# Patient Record
Sex: Female | Born: 1995 | Hispanic: No | Marital: Single | Smoking: Never smoker
Health system: Southern US, Community
[De-identification: ages and names within clinical notes are randomized; demographics above are authoritative.]

## PROBLEM LIST (undated history)

## (undated) ENCOUNTER — Ambulatory Visit (HOSPITAL_COMMUNITY): Admission: EM | Disposition: A | Discharge status: Home / Self Care | Payer: Self-pay

## (undated) DIAGNOSIS — F32A Depression, unspecified: Secondary | ICD-10-CM

## (undated) DIAGNOSIS — F419 Anxiety disorder, unspecified: Secondary | ICD-10-CM

## (undated) DIAGNOSIS — O24419 Gestational diabetes mellitus in pregnancy, unspecified control: Secondary | ICD-10-CM

## (undated) HISTORY — PX: CHOLECYSTECTOMY: SHX55

---

## 2016-10-24 ENCOUNTER — Observation Stay
Admission: AD | Admit: 2016-10-24 | Payer: Self-pay | Source: Other Acute Inpatient Hospital | Admitting: Internal Medicine

## 2020-03-04 ENCOUNTER — Emergency Department (HOSPITAL_COMMUNITY): Admission: EM | Admit: 2020-03-04 | Payer: Self-pay | Source: Home / Self Care

## 2020-03-04 ENCOUNTER — Other Ambulatory Visit: Payer: Self-pay

## 2020-04-14 ENCOUNTER — Encounter: Payer: Self-pay | Admitting: Emergency Medicine

## 2020-04-14 ENCOUNTER — Emergency Department (HOSPITAL_COMMUNITY)
Admission: EM | Admit: 2020-04-14 | Discharge: 2020-04-14 | Disposition: A | Discharge status: Home / Self Care | Payer: Medicaid Other | Attending: Emergency Medicine | Admitting: Emergency Medicine

## 2020-04-14 DIAGNOSIS — O98319 Other infections with a predominantly sexual mode of transmission complicating pregnancy, unspecified trimester: Secondary | ICD-10-CM | POA: Diagnosis present

## 2020-04-14 DIAGNOSIS — Z202 Contact with and (suspected) exposure to infections with a predominantly sexual mode of transmission: Secondary | ICD-10-CM | POA: Insufficient documentation

## 2020-04-14 DIAGNOSIS — Z3A00 Weeks of gestation of pregnancy not specified: Secondary | ICD-10-CM | POA: Insufficient documentation

## 2020-04-14 LAB — URINALYSIS, ROUTINE W REFLEX MICROSCOPIC
Bilirubin Urine: NEGATIVE
Glucose, UA: NEGATIVE mg/dL
Hgb urine dipstick: NEGATIVE
Ketones, ur: NEGATIVE mg/dL
Leukocytes,Ua: NEGATIVE
Nitrite: NEGATIVE
Protein, ur: NEGATIVE mg/dL
Specific Gravity, Urine: 1.014 (ref 1.005–1.030)
pH: 6 (ref 5.0–8.0)

## 2020-04-14 LAB — POC URINE PREG, ED: Preg Test, Ur: POSITIVE — AB

## 2020-04-14 MED ORDER — LIDOCAINE HCL (PF) 1 % IJ SOLN
INTRAMUSCULAR | Status: AC
  Filled 2020-04-14: qty 5

## 2020-04-14 MED ORDER — AZITHROMYCIN 1 G PO PACK
1.0000 g | PACK | Freq: Once | ORAL | Status: AC
  Administered 2020-04-14: 1 g via ORAL
  Filled 2020-04-14: qty 1

## 2020-04-14 MED ORDER — LIDOCAINE HCL (PF) 1 % IJ SOLN
1.0000 mL | Freq: Once | INTRAMUSCULAR | Status: AC
  Administered 2020-04-14: 1 mL

## 2020-04-14 MED ORDER — CEFTRIAXONE SODIUM 500 MG IJ SOLR
500.0000 mg | Freq: Once | INTRAMUSCULAR | Status: AC
  Administered 2020-04-14: 500 mg via INTRAMUSCULAR
  Filled 2020-04-14: qty 500

## 2020-04-14 NOTE — ED Provider Notes (Signed)
[facility] EMERGENCY DEPARTMENT Provider Note   CSN: [id number] Arrival date & time: 04/14/20  2536     History  CC: std exposure  Tammy Mendez is a 24 y.o. female.  Patient here for treatment again for chlamydia.  Tested positive for recently but her sex partner never got treated.  They had sex after she completed treatment and concern for need for treatment.  She states that she tested positive for pregnancy today.  But not having any abdominal pain or vaginal bleeding.  The history is provided by the patient.  Exposure to STD This is a new problem. The problem has not changed since onset.Nothing aggravates the symptoms. Nothing relieves the symptoms. She has tried nothing for the symptoms. The treatment provided no relief.       No past medical history on file.  There are no problems to display for this patient.    OB History    Gravida  1   Para      Term      Preterm      AB      Living        SAB      TAB      Ectopic      Multiple      Live Births              No family history on file.  Social History   Tobacco Use  . Smoking status: Not on file  Substance Use Topics  . Alcohol use: Not on file  . Drug use: Not on file    Home Medications Prior to Admission medications   Not on File    Allergies    Patient has no allergy information on record.  Review of Systems   Review of Systems  Constitutional: Negative for fever.  Genitourinary: Negative for decreased urine volume, difficulty urinating, dyspareunia, dysuria, enuresis, flank pain, frequency, genital sores, hematuria, menstrual problem, pelvic pain, urgency, vaginal bleeding, vaginal discharge and vaginal pain.    Physical Exam Updated Vital Signs  ED Triage Vitals  Enc Vitals Group     BP 04/14/20 0912 (!) 125/100     Pulse Rate 04/14/20 0912 96     Resp 04/14/20 0912 18     Temp 04/14/20 0912 98.2 F (36.8 C)     Temp Source 04/14/20 0912 Oral      SpO2 04/14/20 0912 98 %     Weight --      Height --      Head Circumference --      Peak Flow --      Pain Score 04/14/20 0919 0     Pain Loc --      Pain Edu? --      Excl. in GC? --     Physical Exam Constitutional:      General: She is not in acute distress.    Appearance: She is not ill-appearing.  Abdominal:     General: Abdomen is flat.     Tenderness: There is no abdominal tenderness.  Neurological:     Mental Status: She is alert.     ED Results / Procedures / Treatments   Labs (all labs ordered are listed, but only abnormal results are displayed) Labs Reviewed  POC URINE PREG, ED - Abnormal; Notable for the following components:      Result Value   Preg Test, Ur POSITIVE (*)    All other components within  normal limits  URINALYSIS, ROUTINE W REFLEX MICROSCOPIC  GC/CHLAMYDIA PROBE AMP ([place]) NOT AT [facility]    EKG None  Radiology No results found.  Procedures Procedures (including critical care time)  Medications Ordered in ED Medications  azithromycin (ZITHROMAX) powder 1 g (has no administration in time range)  cefTRIAXone (ROCEPHIN) injection 500 mg (has no administration in time range)    ED Course  I have reviewed the triage vital signs and the nursing notes.  Pertinent labs & imaging results that were available during my care of the patient were reviewed by me and considered in my medical decision making (see chart for details).    MDM Rules/Calculators/A&P                      Tammy Mendez is a 24 year old female here for STD check.  Normal vitals.  No fever.  No symptoms.  Recently found out she is pregnant this morning.  Not having any abdominal pain, vaginal bleeding, discharge.  She just finished course of treatment for chlamydia but states that her sex partner did not get treated and he is now having symptoms.  They have had sex since her treatment.  We will treat with azithro and Rocephin given her pregnancy.  She did just  finished a course of doxycycline.  Given information to follow-up at [facility] clinic.  Recommend that she abstain from sexual activity until she has confirm negative results with repeat pelvic with women's in the future.  Understands return precautions.  This chart was dictated using voice recognition software.  Despite best efforts to proofread,  errors can occur which can change the documentation meaning.    Final Clinical Impression(s) / ED Diagnoses Final diagnoses:  STD exposure    Rx / DC Orders ED Discharge Orders    None       Lennice Sites, DO 04/14/20 1038

## 2020-04-14 NOTE — ED Triage Notes (Signed)
Pt here with c/o std check , pt was just treated for a std 2 weeks ago but her boyfriend never got treated  And they had intercourse again , pt also had a postive pregnancy test this morning

## 2020-04-15 LAB — GC/CHLAMYDIA PROBE AMP (~~LOC~~) NOT AT ARMC
Chlamydia: NEGATIVE
Comment: NEGATIVE
Comment: NORMAL
Neisseria Gonorrhea: NEGATIVE

## 2021-02-01 ENCOUNTER — Other Ambulatory Visit: Payer: Self-pay

## 2021-02-01 ENCOUNTER — Encounter: Payer: Self-pay | Admitting: Emergency Medicine

## 2021-02-01 ENCOUNTER — Emergency Department
Admission: EM | Admit: 2021-02-01 | Discharge: 2021-02-01 | Disposition: A | Discharge status: Home / Self Care | Payer: Medicaid Other | Attending: Emergency Medicine | Admitting: Emergency Medicine

## 2021-02-01 ENCOUNTER — Emergency Department: Payer: Medicaid Other

## 2021-02-01 DIAGNOSIS — Z91040 Latex allergy status: Secondary | ICD-10-CM | POA: Insufficient documentation

## 2021-02-01 DIAGNOSIS — R102 Pelvic and perineal pain: Secondary | ICD-10-CM

## 2021-02-01 DIAGNOSIS — D649 Anemia, unspecified: Secondary | ICD-10-CM | POA: Diagnosis not present

## 2021-02-01 DIAGNOSIS — N939 Abnormal uterine and vaginal bleeding, unspecified: Secondary | ICD-10-CM | POA: Diagnosis not present

## 2021-02-01 HISTORY — DX: Gestational diabetes mellitus in pregnancy, unspecified control: O24.419

## 2021-02-01 LAB — CBC WITH DIFFERENTIAL/PLATELET
Abs Immature Granulocytes: 0.02 10*3/uL (ref 0.00–0.07)
Basophils Absolute: 0 10*3/uL (ref 0.0–0.1)
Basophils Relative: 0 %
Eosinophils Absolute: 0.2 10*3/uL (ref 0.0–0.5)
Eosinophils Relative: 2 %
HCT: 33.2 % — ABNORMAL LOW (ref 36.0–46.0)
Hemoglobin: 9.9 g/dL — ABNORMAL LOW (ref 12.0–15.0)
Immature Granulocytes: 0 %
Lymphocytes Relative: 27 %
Lymphs Abs: 2.7 10*3/uL (ref 0.7–4.0)
MCH: 22 pg — ABNORMAL LOW (ref 26.0–34.0)
MCHC: 29.8 g/dL — ABNORMAL LOW (ref 30.0–36.0)
MCV: 73.8 fL — ABNORMAL LOW (ref 80.0–100.0)
Monocytes Absolute: 0.7 10*3/uL (ref 0.1–1.0)
Monocytes Relative: 7 %
Neutro Abs: 6.4 10*3/uL (ref 1.7–7.7)
Neutrophils Relative %: 64 %
Platelets: 360 10*3/uL (ref 150–400)
RBC: 4.5 MIL/uL (ref 3.87–5.11)
RDW: 16.1 % — ABNORMAL HIGH (ref 11.5–15.5)
WBC: 10 10*3/uL (ref 4.0–10.5)
nRBC: 0 % (ref 0.0–0.2)

## 2021-02-01 LAB — WET PREP, GENITAL
Clue Cells Wet Prep HPF POC: NONE SEEN
Sperm: NONE SEEN
Trich, Wet Prep: NONE SEEN
Yeast Wet Prep HPF POC: NONE SEEN

## 2021-02-01 LAB — COMPREHENSIVE METABOLIC PANEL
ALT: 16 U/L (ref 0–44)
AST: 17 U/L (ref 15–41)
Albumin: 3.7 g/dL (ref 3.5–5.0)
Alkaline Phosphatase: 63 U/L (ref 38–126)
Anion gap: 8 (ref 5–15)
BUN: 15 mg/dL (ref 6–20)
CO2: 25 mmol/L (ref 22–32)
Calcium: 9.1 mg/dL (ref 8.9–10.3)
Chloride: 103 mmol/L (ref 98–111)
Creatinine, Ser: 0.72 mg/dL (ref 0.44–1.00)
GFR, Estimated: >60 mL/min (ref >60)
Glucose, Bld: 90 mg/dL (ref 70–99)
Potassium: 3.9 mmol/L (ref 3.5–5.1)
Sodium: 136 mmol/L (ref 135–145)
Total Bilirubin: 0.6 mg/dL (ref 0.3–1.2)
Total Protein: 8 g/dL (ref 6.5–8.1)

## 2021-02-01 LAB — CHLAMYDIA/NGC RT PCR (ARMC ONLY)
Chlamydia Tr: NOT DETECTED
N gonorrhoeae: NOT DETECTED

## 2021-02-01 LAB — PROTIME-INR
INR: 1 (ref 0.8–1.2)
Prothrombin Time: 12.9 seconds (ref 11.4–15.2)

## 2021-02-01 LAB — HCG, QUANTITATIVE, PREGNANCY: hCG, Beta Chain, Quant, S: 1 m[IU]/mL (ref <5)

## 2021-02-01 LAB — HEMOGLOBIN AND HEMATOCRIT, BLOOD
HCT: 33.1 % — ABNORMAL LOW (ref 36.0–46.0)
Hemoglobin: 9.8 g/dL — ABNORMAL LOW (ref 12.0–15.0)

## 2021-02-01 LAB — TSH: TSH: 1.167 u[IU]/mL (ref 0.350–4.500)

## 2021-02-01 LAB — TYPE AND SCREEN
ABO/RH(D): A POS
Antibody Screen: NEGATIVE

## 2021-02-01 IMAGING — US US PELVIS COMPLETE TRANSABD/TRANSVAG W DUPLEX
1 series · 13 of 25 positions shown · non-contrast
Comparison: None

CLINICAL DATA: Pelvic pain, 8 weeks postpartum, 2-3 days of heavy
bleeding, LMP [DATE]

EXAM:
TRANSABDOMINAL AND TRANSVAGINAL ULTRASOUND OF PELVIS
DOPPLER ULTRASOUND OF OVARIES
TECHNIQUE: Both transabdominal and transvaginal ultrasound examinations of the
pelvis were performed. Transabdominal technique was performed for
global imaging of the pelvis including uterus, ovaries, adnexal
regions, and pelvic cul-de-sac.
It was necessary to proceed with endovaginal exam following the
transabdominal exam to visualize the endometrium and ovaries. Color
and duplex Doppler ultrasound was utilized to evaluate blood flow to
the ovaries.

[Series 1: gyn us · 13 of 171 slices shown]
[im 1/171]
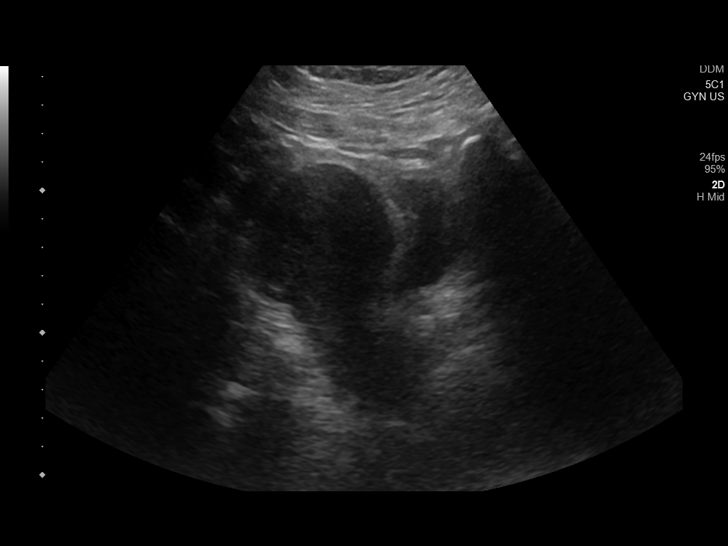
[im 15/171]
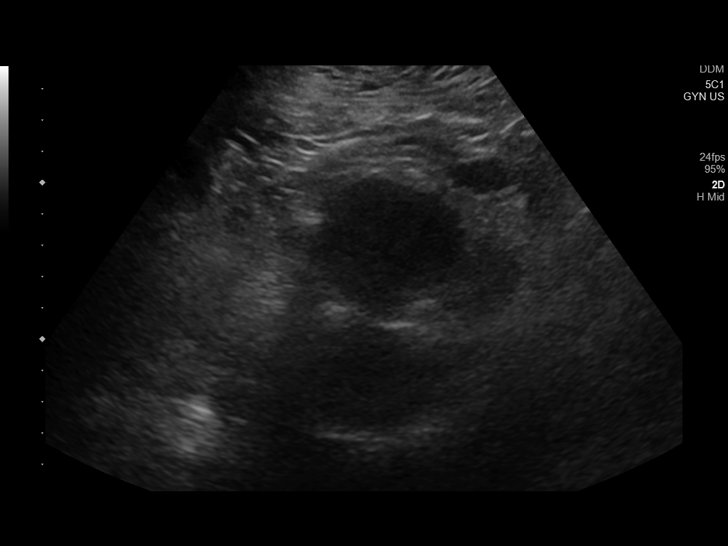
[im 29/171]
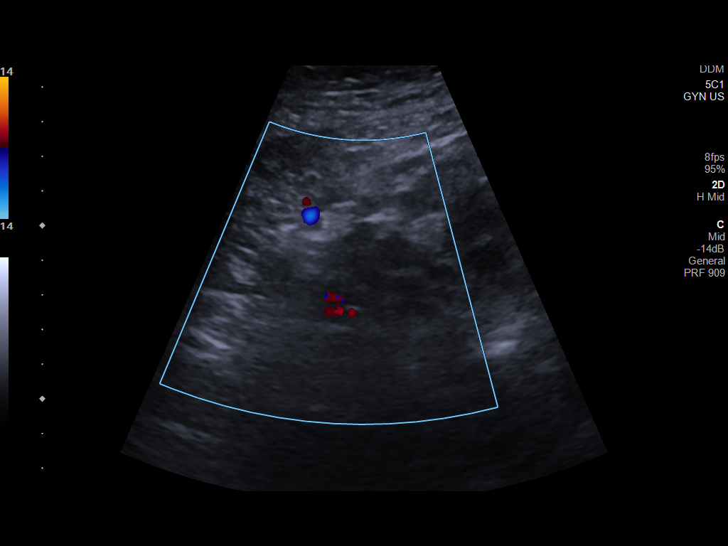
[im 43/171]
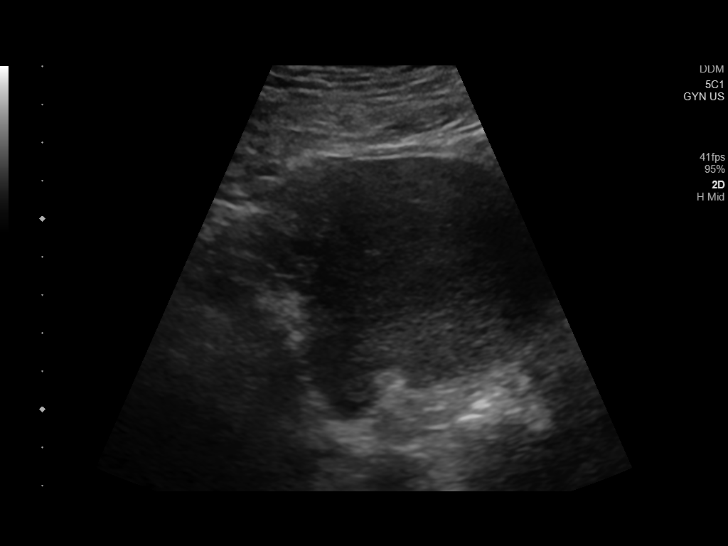
[im 57/171]
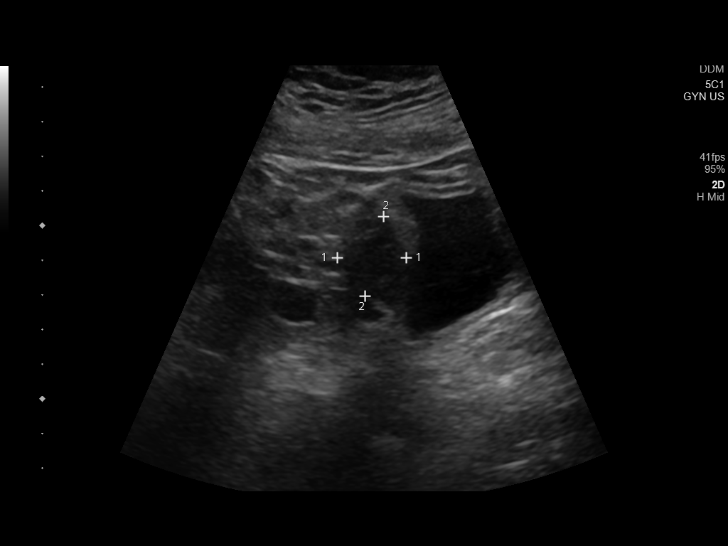
[im 71/171]
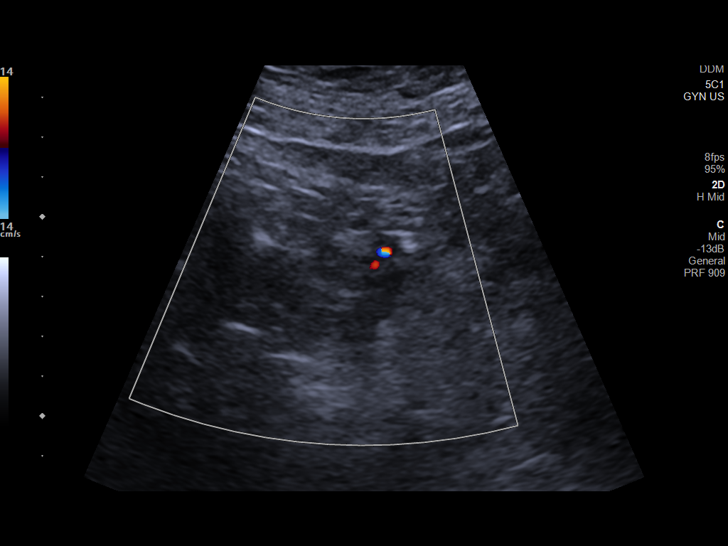
[im 86/171]
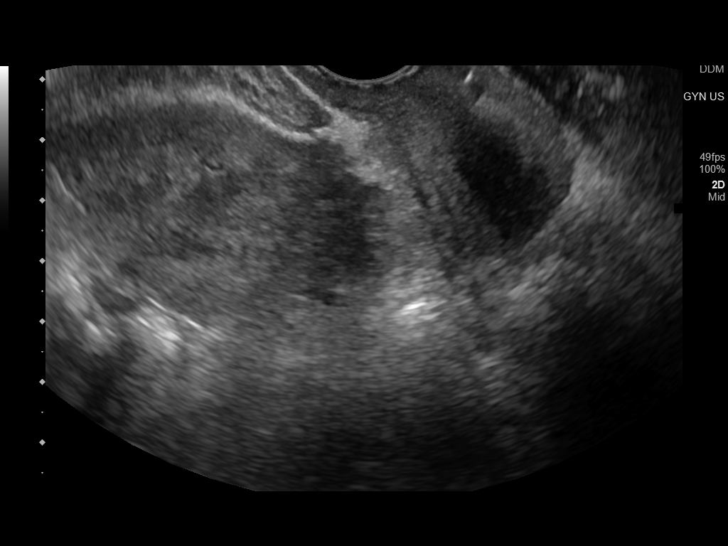
[im 100/171]
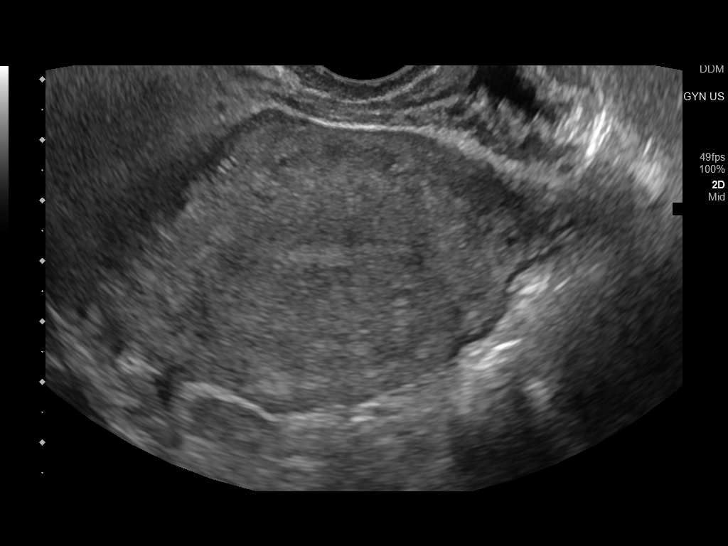
[im 114/171]
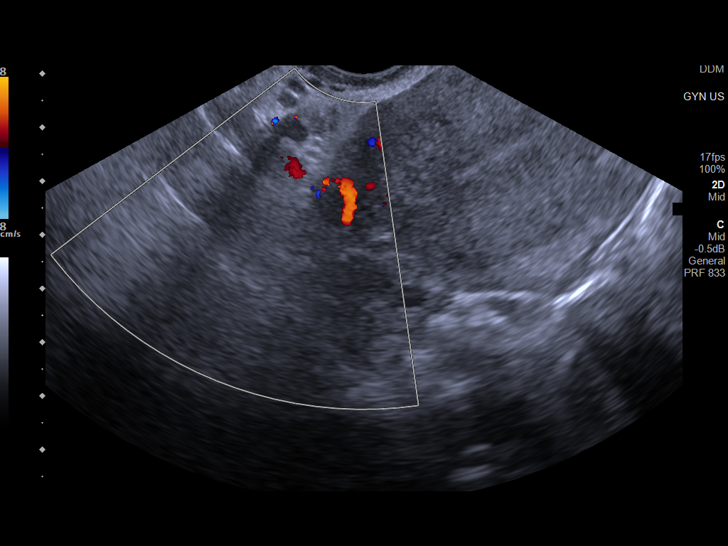
[im 128/171]
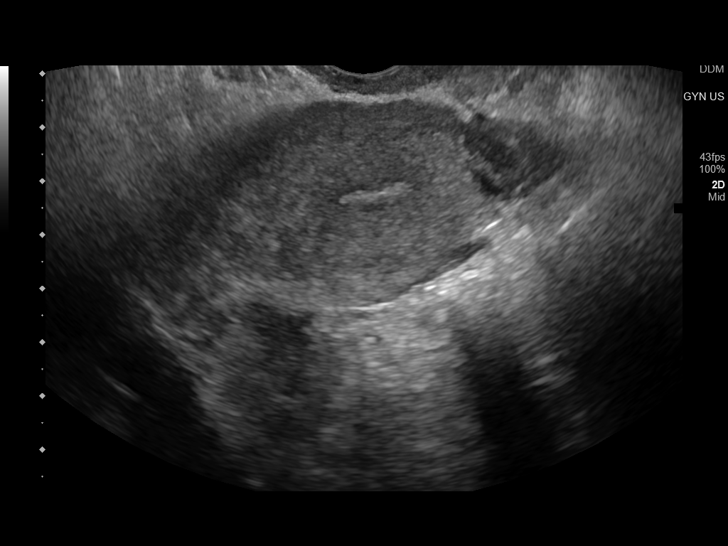
[im 142/171]
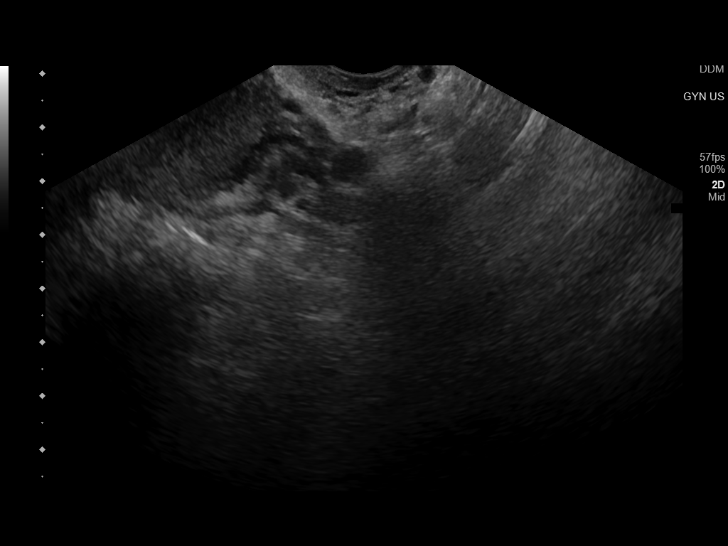
[im 156/171]
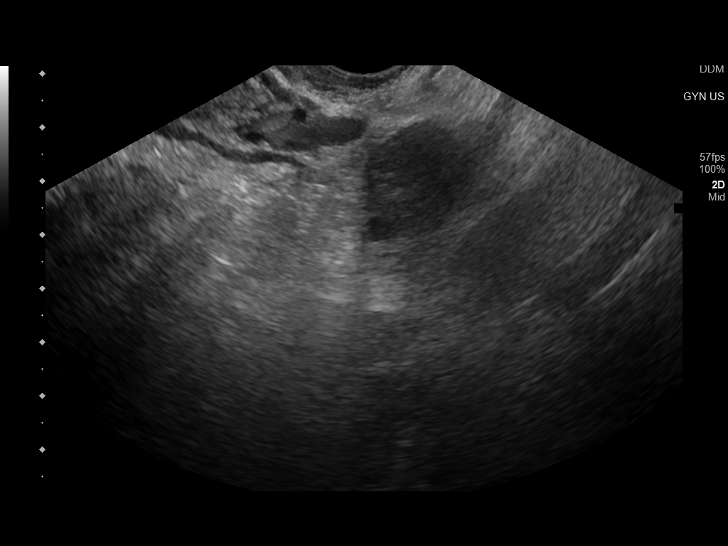
[im 171/171]
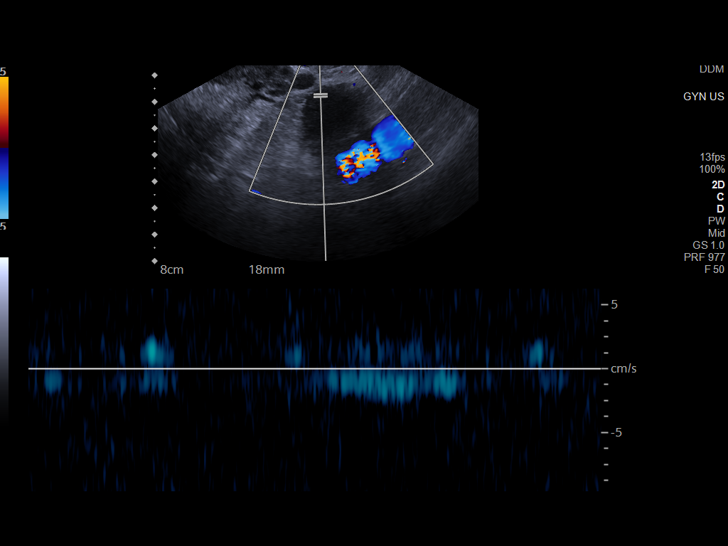

[13 of 25 positions shown; findings below may reference images not displayed]

FINDINGS: Uterus

Measurements: 9.0 x 4.7 x 6.2 cm = volume: 135 mL. Anteverted.
Normal morphology without mass.

Endometrium

Thickness: 5 mm.  No endometrial fluid or focal abnormality

Right ovary

Measurements: 2.5 x 3.1 x 2.3 cm = volume: 9.4 mL. Normal morphology
without mass. Internal blood flow present on color Doppler imaging.

Left ovary

Measurements: 2.1 x 2.6 x 2.3 cm = volume: 6.5 mL. Normal morphology
with small hemorrhagic corpus luteum; no follow-up imaging
recommended. Blood flow present within LEFT ovary on color Doppler
imaging.

Pulsed Doppler evaluation of both ovaries demonstrates venous
waveforms bilaterally. Arterial waveform within the RIGHT ovary is
low resistance. Arterial waveform within the LEFT ovary is less well
visualized.

Other findings

No free pelvic fluid or adnexal masses.
IMPRESSION: No definite pelvic sonographic abnormalities identified.

Suboptimal arterial waveform in the LEFT ovary though the venous
waveform is normal and the LEFT ovary shows no significant
morphologic changes to suggest torsion.

## 2021-02-01 MED ORDER — FERROUS GLUCONATE 240 (27 FE) MG PO TABS: 240.0000 mg | ORAL_TABLET | Freq: Three times a day (TID) | ORAL | 0 refills | Status: DC

## 2021-02-01 MED ORDER — MEDROXYPROGESTERONE ACETATE 5 MG PO TABS: 20.0000 mg | ORAL_TABLET | Freq: Three times a day (TID) | ORAL | 0 refills | Status: DC

## 2021-02-01 MED ORDER — MEDROXYPROGESTERONE ACETATE 10 MG PO TABS
20.0000 mg | ORAL_TABLET | ORAL | Status: AC
  Administered 2021-02-01: 20 mg via ORAL
  Filled 2021-02-01: qty 2

## 2021-02-01 MED ORDER — ACETAMINOPHEN 500 MG PO TABS
1000.0000 mg | ORAL_TABLET | Freq: Once | ORAL | Status: AC
  Administered 2021-02-01: 1000 mg via ORAL
  Filled 2021-02-01: qty 2

## 2021-02-01 MED ORDER — IBUPROFEN 800 MG PO TABS
800.0000 mg | ORAL_TABLET | Freq: Once | ORAL | Status: AC
  Administered 2021-02-01: 800 mg via ORAL
  Filled 2021-02-01: qty 1

## 2021-02-01 NOTE — ED Notes (Signed)
Assisted Dr.Smith with pelvic exam. Pt had a female friend that wanted to stay in room while the exam was being done.Pt did give consent to Dr.Smith/Tech Shawna Orleans she was ok with female friend staying in room. We completed the exam with no issues. Swabs were obtained and sent to lab.

## 2021-02-01 NOTE — ED Provider Notes (Signed)
[facility] Emergency Department Provider Note  ____________________________________________   Event Date/Time   First MD Initiated Contact with Patient 02/01/21 1210     (approximate)  I have reviewed the triage vital signs and the nursing notes.   HISTORY  Chief Complaint Vaginal Bleeding   HPI Tammy Mendez is a 25 y.o. female G2, P2 with a past medical history of obesity cholecystectomy and gestational diabetes who presents for assessment approximately 8 weeks postpartum for increase in vaginal bleeding.  Patient states she had a vaginal delivery at approximately 38 weeks that was relatively uncomplicated aside from some blood loss requiring a transfusion and had some bleeding for couple days afterwards that seem to subside and consist of only intermittent spotting but then picked up again over the last couple of days.  Patient states that over the last day she has been going through a tampon and pad every 2 hours.  He also endorses some increased fatigue and weakness over the last 2 or 3 days.  She is not currently breast-feeding or pumping.  She also endorses some mild suprapubic discomfort from crampy pain.  She denies any headache, earache, sore throat, chest pain, cough, shortness of breath, back pain, upper abdominal pain, urinary symptoms, diarrhea, blood in her stool, rash, recent traumatic injuries or falls or any extremity symptoms.  She states she was never diagnosed with any coagulation or bleeding disorders and does not take any blood thinners.  No clear alleviating aggravating factors.  No prior similar episodes.         Past Medical History:  Diagnosis Date  . Gestational diabetes     There are no problems to display for this patient.   Past Surgical History:  Procedure Laterality Date  . CHOLECYSTECTOMY      Prior to Admission medications   Medication Sig Start Date End Date Taking? Authorizing Provider  ferrous gluconate (IRON 27)  240 (27 FE) MG tablet Take 1 tablet (240 mg total) by mouth 3 (three) times daily with meals for 7 days. 02/01/21 02/08/21 Yes Gilles Chiquito, MD  medroxyPROGESTERone (PROVERA) 5 MG tablet Take 4 tablets (20 mg total) by mouth in the morning, at noon, and at bedtime for 7 days. 02/01/21 02/08/21 Yes Gilles Chiquito, MD    Allergies Latex  No family history on file.  Social History Social History   Tobacco Use  . Smoking status: Never Smoker  . Smokeless tobacco: Never Used  Substance Use Topics  . Alcohol use: Yes    Comment: Occ  . Drug use: Not Currently    Review of Systems  Review of Systems  Constitutional: Negative for chills and fever.  HENT: Negative for sore throat.   Eyes: Negative for pain.  Respiratory: Negative for cough and stridor.   Cardiovascular: Negative for chest pain.  Gastrointestinal: Positive for abdominal pain. Negative for vomiting.  Genitourinary: Negative for dysuria, flank pain, hematuria and urgency.  Musculoskeletal: Negative for myalgias.  Skin: Negative for rash.  Neurological: Negative for seizures, loss of consciousness and headaches.  Psychiatric/Behavioral: Negative for suicidal ideas.  All other systems reviewed and are negative.     ____________________________________________   PHYSICAL EXAM:  VITAL SIGNS: ED Triage Vitals  Enc Vitals Group     BP 02/01/21 1041 (!) 149/95     Pulse Rate 02/01/21 1041 85     Resp 02/01/21 1041 18     Temp 02/01/21 1041 98.4 F (36.9 C)     Temp  Source 02/01/21 1041 Oral     SpO2 02/01/21 1041 99 %     Weight 02/01/21 1042 (!) 315 lb (142.9 kg)     Height 02/01/21 1042 5\' 10"  (1.778 m)     Head Circumference --      Peak Flow --      Pain Score 02/01/21 1042 8     Pain Loc --      Pain Edu? --      Excl. in GC? --    Vitals:   02/01/21 1041  BP: (!) 149/95  Pulse: 85  Resp: 18  Temp: 98.4 F (36.9 C)  SpO2: 99%   Physical Exam Vitals and nursing note reviewed. Exam conducted  with a chaperone present.  Constitutional:      General: She is not in acute distress.    Appearance: She is well-developed. She is obese.  HENT:     Head: Normocephalic and atraumatic.     Right Ear: External ear normal.     Left Ear: External ear normal.     Nose: Nose normal.  Eyes:     Conjunctiva/sclera: Conjunctivae normal.  Cardiovascular:     Rate and Rhythm: Normal rate and regular rhythm.     Pulses: Normal pulses.     Heart sounds: No murmur heard.   Pulmonary:     Effort: Pulmonary effort is normal. No respiratory distress.     Breath sounds: Normal breath sounds.  Abdominal:     Palpations: Abdomen is soft.     Tenderness: There is no abdominal tenderness.  Genitourinary:    Cervix: No cervical motion tenderness, friability or erythema.     Comments: Scant blood oozing from closed cervix  Musculoskeletal:     Cervical back: Neck supple.  Skin:    General: Skin is warm and dry.     Capillary Refill: Capillary refill takes less than 2 seconds.  Neurological:     Mental Status: She is alert and oriented to [place], place, and time.  Psychiatric:        Mood and Affect: Mood normal.      ____________________________________________   LABS (all labs ordered are listed, but only abnormal results are displayed)  Labs Reviewed  WET PREP, GENITAL - Abnormal; Notable for the following components:      Result Value   WBC, Wet Prep HPF POC FEW (*)    All other components within normal limits  CBC WITH DIFFERENTIAL/PLATELET - Abnormal; Notable for the following components:   Hemoglobin 9.9 (*)    HCT 33.2 (*)    MCV 73.8 (*)    MCH 22.0 (*)    MCHC 29.8 (*)    RDW 16.1 (*)    All other components within normal limits  HEMOGLOBIN AND HEMATOCRIT, BLOOD - Abnormal; Notable for the following components:   Hemoglobin 9.8 (*)    HCT 33.1 (*)    All other components within normal limits  CHLAMYDIA/NGC RT PCR ([facility] ONLY)  COMPREHENSIVE METABOLIC PANEL   HCG, QUANTITATIVE, PREGNANCY  TSH  PROTIME-INR  TYPE AND SCREEN   ____________________________________________  EKG  ____________________________________________  RADIOLOGY  ED MD interpretation: No acute pelvic abnormalities noted.  Official radiology report(s): US PELVIC COMPLETE W TRANSVAGINAL AND TORSION R/O  Result Date: 02/01/2021 CLINICAL DATA:  Pelvic pain, 8 weeks postpartum, 2-3 days of heavy bleeding, LMP 01/29/2021 EXAM: TRANSABDOMINAL AND TRANSVAGINAL ULTRASOUND OF PELVIS DOPPLER ULTRASOUND OF OVARIES TECHNIQUE: Both transabdominal and transvaginal ultrasound examinations of the pelvis were  performed. Transabdominal technique was performed for global imaging of the pelvis including uterus, ovaries, adnexal regions, and pelvic cul-de-sac. It was necessary to proceed with endovaginal exam following the transabdominal exam to visualize the endometrium and ovaries. Color and duplex Doppler ultrasound was utilized to evaluate blood flow to the ovaries. COMPARISON:  None FINDINGS: Uterus Measurements: 9.0 x 4.7 x 6.2 cm = volume: 135 mL. Anteverted. Normal morphology without mass. Endometrium Thickness: 5 mm.  No endometrial fluid or focal abnormality Right ovary Measurements: 2.5 x 3.1 x 2.3 cm = volume: 9.4 mL. Normal morphology without mass. Internal blood flow present on color Doppler imaging. Left ovary Measurements: 2.1 x 2.6 x 2.3 cm = volume: 6.5 mL. Normal morphology with small hemorrhagic corpus luteum; no follow-up imaging recommended. Blood flow present within LEFT ovary on color Doppler imaging. Pulsed Doppler evaluation of both ovaries demonstrates venous waveforms bilaterally. Arterial waveform within the RIGHT ovary is low resistance. Arterial waveform within the LEFT ovary is less well visualized. Other findings No free pelvic fluid or adnexal masses. IMPRESSION: No definite pelvic sonographic abnormalities identified. Suboptimal arterial waveform in the LEFT ovary though  the venous waveform is normal and the LEFT ovary shows no significant morphologic changes to suggest torsion. Electronically Signed   By: Ulyses Southward M.D.   On: 02/01/2021 14:35    ____________________________________________   PROCEDURES  Procedure(s) performed (including Critical Care):  .1-3 Lead EKG Interpretation Performed by: Gilles Chiquito, MD Authorized by: Gilles Chiquito, MD     Interpretation: normal     ECG rate assessment: normal     Rhythm: sinus rhythm     Ectopy: none     Conduction: normal       ____________________________________________   INITIAL IMPRESSION / ASSESSMENT AND PLAN / ED COURSE      Patient presents with above history and exam for assessment of some vaginal bleeding approximately 8 weeks postpartum.  On arrival she is afebrile hemodynamically stable.   Differential includes endometrial polyp, adenomyosis, leiomyoma, malignancy, coagulopathy, ovulatory dysfunction, endometrial possible vaginal trauma that may have begun bleeding again from recent pregnancy.  She does note she had intercourse couple weeks ago but her increase in bleeding did not start right after that.  Low suspicion for iatrogenic etiology.  Ultrasound obtained shows no retained products of conception or other endometrial abnormality.  Will arterial waveforms, clearly document left ovary ovaries are otherwise unremarkable bilaterally and overall very low suspicion for torsion or PID at this time.  No findings on exam to suggest acute infectious process.  CBC shows hemoglobin of 9.9 this is compared to 8.4 on 2/4.  Seems to be appropriately increasing after blood loss associated with recent pregnancy.  There is no leukocytosis and platelets are normal.  hCG is negative.  TSH is unremarkable and wet prep and GC studies are unremarkable.  INR is unremarkable and given patient has no history of coagulopathy or lower suspicion for undiagnosed bleeding disorder at this time.   Hemoglobin recheck after approximately 4 hours in the ED is 9.8 from 9.9 essentially unchanged.  Patient given Tylenol ibuprofen and medroxyprogesterone.  Suspect this is related to possible hormonal derangements after pregnancy however low suspicion for immediately life-threatening hemorrhage.  She was given a prescription for medroxyprogesterone and instructed to follow-up with her OB in the next 24 to 48 hours.  Strict return precautions discussed including any development of worsening bleeding, lightheadedness, chest pain, shortness of breath or any other acute change in symptoms.  Also advised patient to start taking iron supplementation for her anemia.  Discharge stable condition.  Strict impressions advised discussed.      ____________________________________________   FINAL CLINICAL IMPRESSION(S) / ED DIAGNOSES  Final diagnoses:  Pelvic pain  Postpartum hemorrhage, unspecified type  Anemia, unspecified type    Medications  acetaminophen (TYLENOL) tablet 1,000 mg (1,000 mg Oral Given 02/01/21 1255)  ibuprofen (ADVIL) tablet 800 mg (800 mg Oral Given 02/01/21 1255)  medroxyPROGESTERone (PROVERA) tablet 20 mg (20 mg Oral Given 02/01/21 1506)     ED Discharge Orders         Ordered    medroxyPROGESTERone (PROVERA) 5 MG tablet  3 times daily        02/01/21 1443    ferrous gluconate (IRON 27) 240 (27 FE) MG tablet  3 times daily with meals        02/01/21 1444           Note:  This document was prepared using Dragon voice recognition software and may include unintentional dictation errors.   Gilles Chiquito, MD 02/01/21 1539

## 2021-02-01 NOTE — ED Triage Notes (Signed)
Pt to ED via POV with c/o vaginal bleeding. Pt states is 8 weeks postpartum. Pt states has had vaginal bleeding since the delivery however the last few days has noticed increased vaginal bleeding. Pt states is bleeding through super tampon and pad q 2 hrs, pt states hx of anemia, also c/o increasing fatigue and weakness.   Pt states is not currently breast feeding or pumping.

## 2021-10-02 ENCOUNTER — Emergency Department: Payer: Medicaid Other | Admitting: Anesthesiology

## 2021-10-02 ENCOUNTER — Encounter: Payer: Self-pay | Admitting: Obstetrics and Gynecology

## 2021-10-02 ENCOUNTER — Encounter
Admission: EM | Disposition: A | Discharge status: Home / Self Care | Payer: Self-pay | Source: Home / Self Care | Attending: Emergency Medicine

## 2021-10-02 ENCOUNTER — Emergency Department: Payer: Medicaid Other

## 2021-10-02 ENCOUNTER — Other Ambulatory Visit: Payer: Self-pay

## 2021-10-02 ENCOUNTER — Ambulatory Visit
Admission: EM | Admit: 2021-10-02 | Discharge: 2021-10-03 | Disposition: A | Discharge status: Home / Self Care | Payer: Medicaid Other | Attending: Obstetrics and Gynecology | Admitting: Obstetrics and Gynecology

## 2021-10-02 DIAGNOSIS — F419 Anxiety disorder, unspecified: Secondary | ICD-10-CM | POA: Insufficient documentation

## 2021-10-02 DIAGNOSIS — N939 Abnormal uterine and vaginal bleeding, unspecified: Secondary | ICD-10-CM

## 2021-10-02 DIAGNOSIS — O99321 Drug use complicating pregnancy, first trimester: Secondary | ICD-10-CM | POA: Insufficient documentation

## 2021-10-02 DIAGNOSIS — F32A Depression, unspecified: Secondary | ICD-10-CM | POA: Diagnosis not present

## 2021-10-02 DIAGNOSIS — O99341 Other mental disorders complicating pregnancy, first trimester: Secondary | ICD-10-CM | POA: Insufficient documentation

## 2021-10-02 DIAGNOSIS — F1410 Cocaine abuse, uncomplicated: Secondary | ICD-10-CM | POA: Diagnosis not present

## 2021-10-02 DIAGNOSIS — O036 Delayed or excessive hemorrhage following complete or unspecified spontaneous abortion: Secondary | ICD-10-CM | POA: Insufficient documentation

## 2021-10-02 DIAGNOSIS — F1490 Cocaine use, unspecified, uncomplicated: Secondary | ICD-10-CM | POA: Diagnosis not present

## 2021-10-02 DIAGNOSIS — Z3A12 12 weeks gestation of pregnancy: Secondary | ICD-10-CM | POA: Insufficient documentation

## 2021-10-02 DIAGNOSIS — O4690 Antepartum hemorrhage, unspecified, unspecified trimester: Secondary | ICD-10-CM

## 2021-10-02 DIAGNOSIS — O031 Delayed or excessive hemorrhage following incomplete spontaneous abortion: Secondary | ICD-10-CM

## 2021-10-02 DIAGNOSIS — O99211 Obesity complicating pregnancy, first trimester: Secondary | ICD-10-CM | POA: Diagnosis not present

## 2021-10-02 DIAGNOSIS — I959 Hypotension, unspecified: Secondary | ICD-10-CM

## 2021-10-02 DIAGNOSIS — D62 Acute posthemorrhagic anemia: Secondary | ICD-10-CM | POA: Insufficient documentation

## 2021-10-02 HISTORY — PX: DILATION AND EVACUATION: SHX1459

## 2021-10-02 HISTORY — DX: Anxiety disorder, unspecified: F41.9

## 2021-10-02 HISTORY — DX: Depression, unspecified: F32.A

## 2021-10-02 LAB — CBC WITH DIFFERENTIAL/PLATELET
Abs Immature Granulocytes: 0.1 10*3/uL — ABNORMAL HIGH (ref 0.00–0.07)
Basophils Absolute: 0 10*3/uL (ref 0.0–0.1)
Basophils Relative: 0 %
Eosinophils Absolute: 0.5 10*3/uL (ref 0.0–0.5)
Eosinophils Relative: 3 %
HCT: 31.1 % — ABNORMAL LOW (ref 36.0–46.0)
Hemoglobin: 9.3 g/dL — ABNORMAL LOW (ref 12.0–15.0)
Immature Granulocytes: 1 %
Lymphocytes Relative: 22 %
Lymphs Abs: 4.2 10*3/uL — ABNORMAL HIGH (ref 0.7–4.0)
MCH: 21.3 pg — ABNORMAL LOW (ref 26.0–34.0)
MCHC: 29.9 g/dL — ABNORMAL LOW (ref 30.0–36.0)
MCV: 71.3 fL — ABNORMAL LOW (ref 80.0–100.0)
Monocytes Absolute: 1.3 10*3/uL — ABNORMAL HIGH (ref 0.1–1.0)
Monocytes Relative: 7 %
Neutro Abs: 13.1 10*3/uL — ABNORMAL HIGH (ref 1.7–7.7)
Neutrophils Relative %: 67 %
Platelets: 435 10*3/uL — ABNORMAL HIGH (ref 150–400)
RBC: 4.36 MIL/uL (ref 3.87–5.11)
RDW: 16.9 % — ABNORMAL HIGH (ref 11.5–15.5)
WBC: 19.2 10*3/uL — ABNORMAL HIGH (ref 4.0–10.5)
nRBC: 0 % (ref 0.0–0.2)

## 2021-10-02 LAB — COMPREHENSIVE METABOLIC PANEL
ALT: 10 U/L (ref 0–44)
AST: 17 U/L (ref 15–41)
Albumin: 3.4 g/dL — ABNORMAL LOW (ref 3.5–5.0)
Alkaline Phosphatase: 42 U/L (ref 38–126)
Anion gap: 7 (ref 5–15)
BUN: 13 mg/dL (ref 6–20)
CO2: 22 mmol/L (ref 22–32)
Calcium: 8.8 mg/dL — ABNORMAL LOW (ref 8.9–10.3)
Chloride: 104 mmol/L (ref 98–111)
Creatinine, Ser: 0.59 mg/dL (ref 0.44–1.00)
GFR, Estimated: >60 mL/min (ref >60)
Glucose, Bld: 138 mg/dL — ABNORMAL HIGH (ref 70–99)
Potassium: 3.6 mmol/L (ref 3.5–5.1)
Sodium: 133 mmol/L — ABNORMAL LOW (ref 135–145)
Total Bilirubin: 0.4 mg/dL (ref 0.3–1.2)
Total Protein: 7.6 g/dL (ref 6.5–8.1)

## 2021-10-02 LAB — PROTIME-INR
INR: 1.1 (ref 0.8–1.2)
Prothrombin Time: 14.2 seconds (ref 11.4–15.2)

## 2021-10-02 LAB — PREPARE RBC (CROSSMATCH)

## 2021-10-02 IMAGING — US US OB < 14 WEEKS - US OB TV
1 of 2 series · 13 of 28 positions shown · non-contrast
Comparison: None.

CLINICAL DATA: Vaginal bleeding. LMP: [DATE] corresponding to
an estimated gestational age of 12 weeks, 3 days.

EXAM:
OBSTETRIC <14 WK US AND TRANSVAGINAL OB US
TECHNIQUE: Both transabdominal and transvaginal ultrasound examinations were
performed for complete evaluation of the gestation as well as the
maternal uterus, adnexal regions, and pelvic cul-de-sac.
Transvaginal technique was performed to assess early pregnancy.

[Series 1: us ob transvaginal · 13 of 87 slices shown]
[im 4/87]
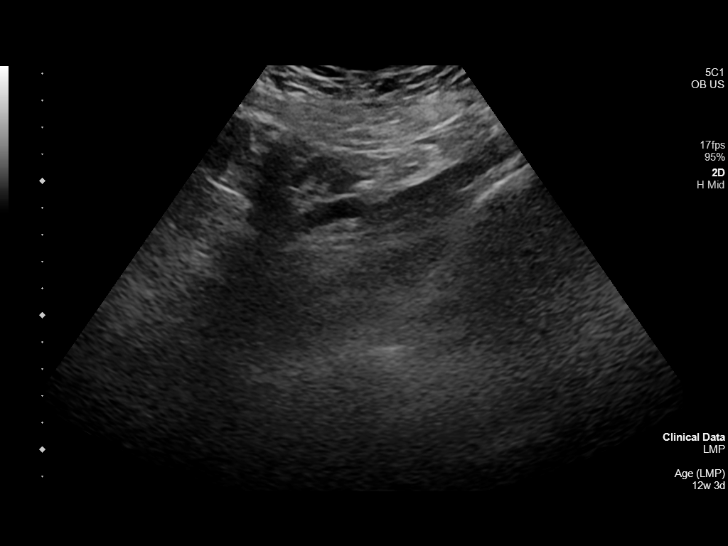
[im 10/87]
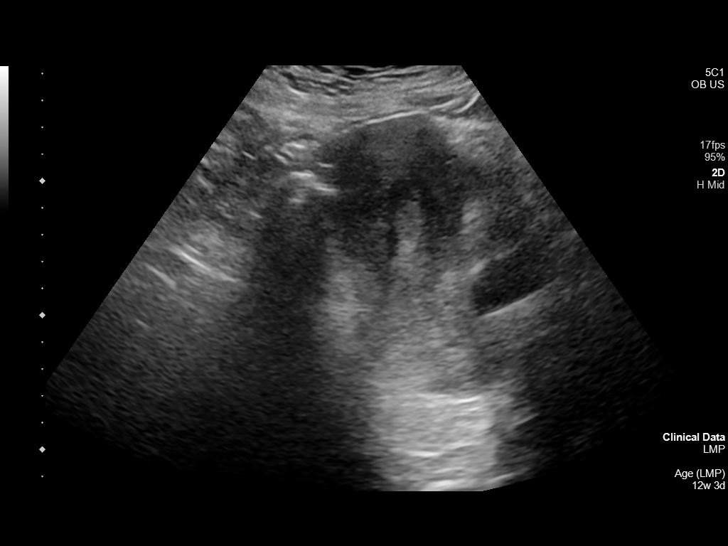
[im 17/87]
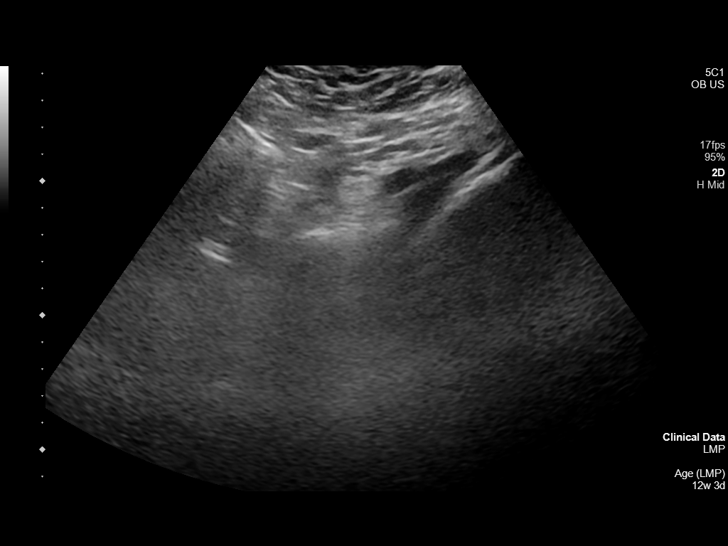
[im 24/87]
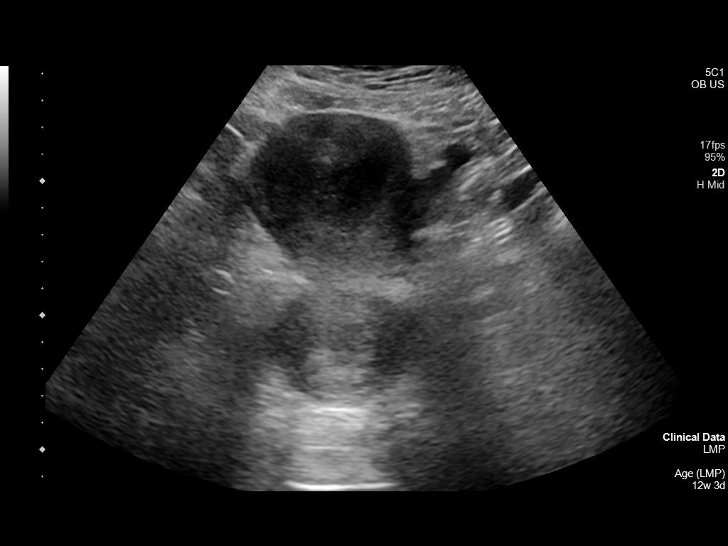
[im 30/87]
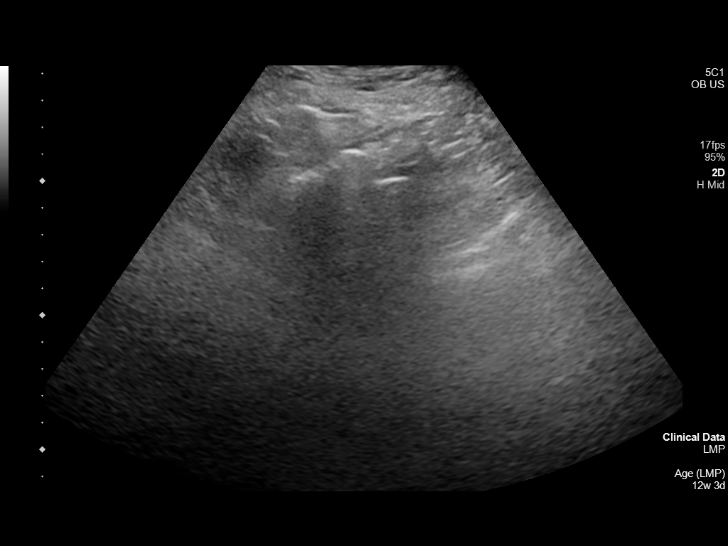
[im 37/87]
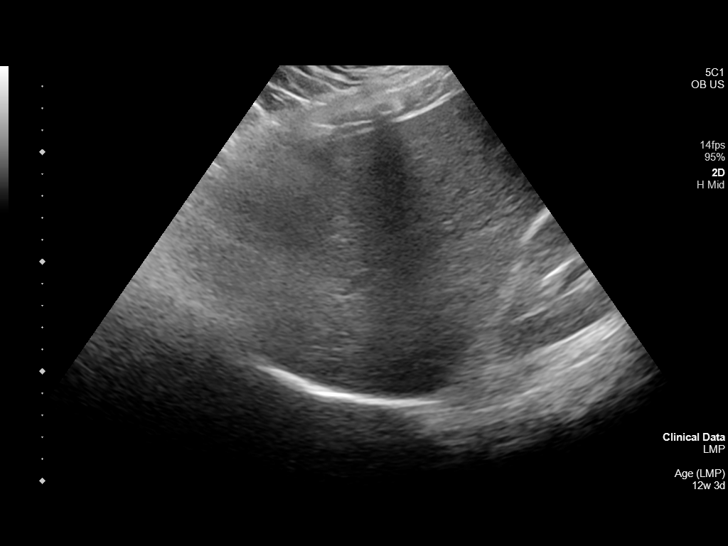
[im 47/87]
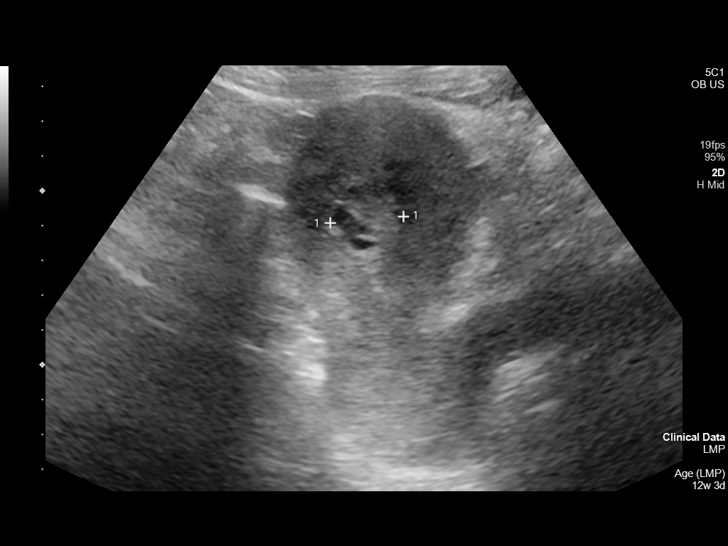
[im 53/87]
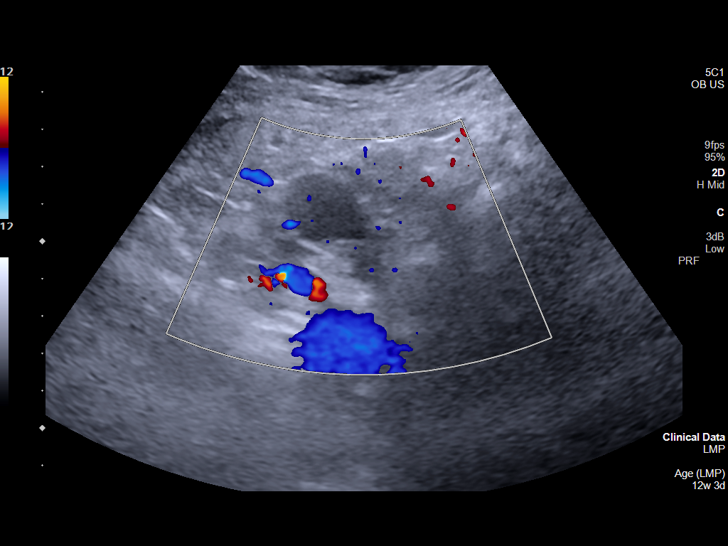
[im 60/87]
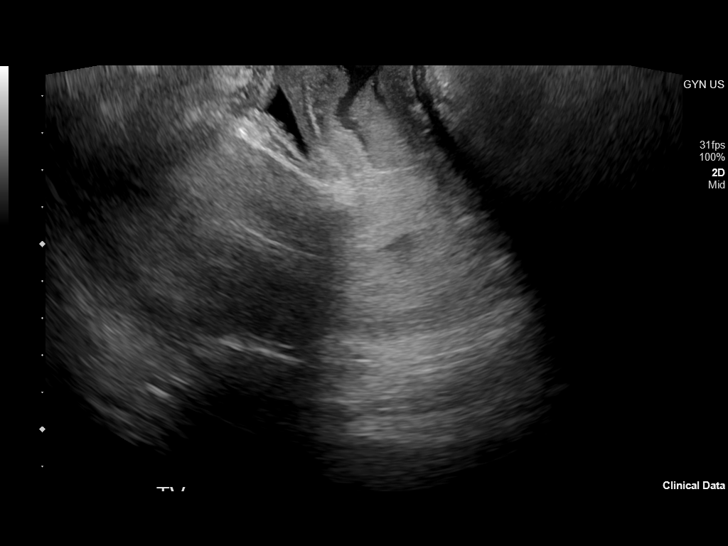
[im 67/87]
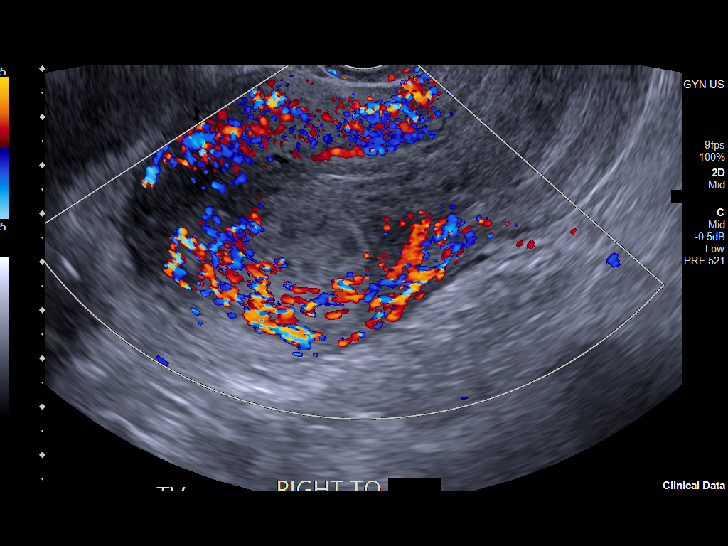
[im 73/87]
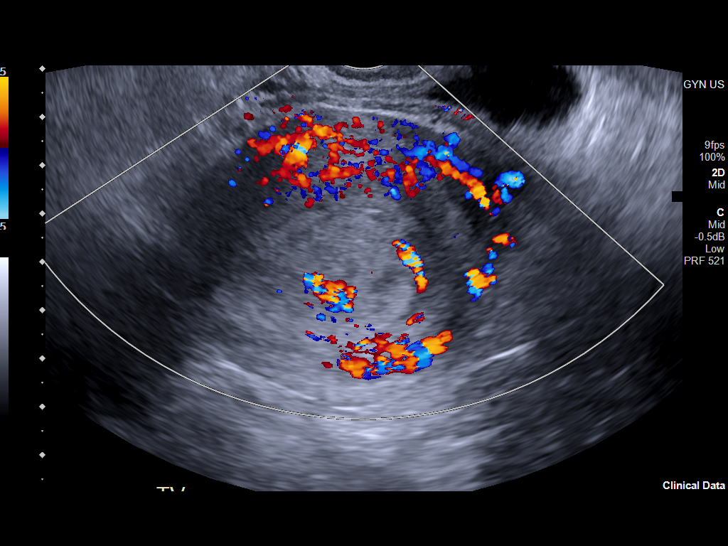
[im 80/87]
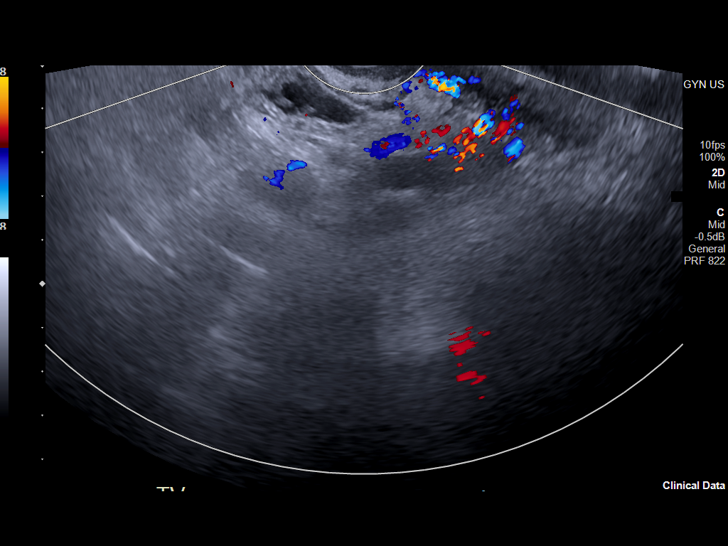
[im 87/87]
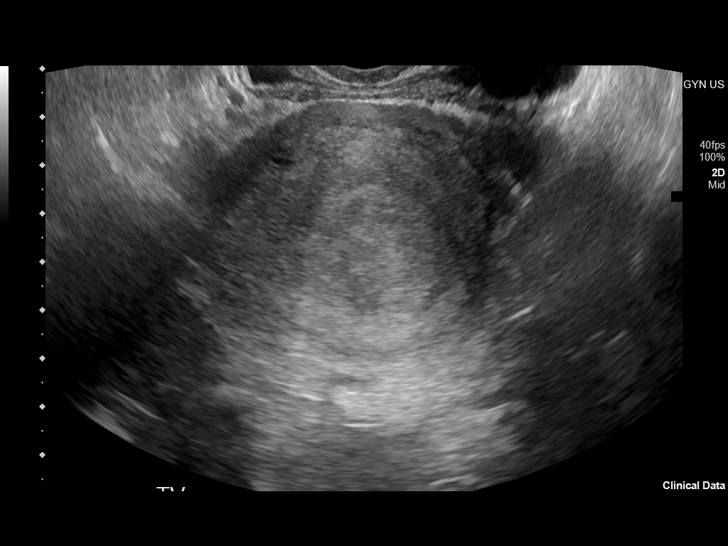

[13 of 28 positions shown; findings below may reference images not displayed]

FINDINGS: The uterus is anteverted.

The endometrium is thickened and heterogeneous and suboptimally
visualized. No intrauterine pregnancy identified. Echogenic content
within the atrium consistent with blood product.

There is a 2.1 x 1.8 cm rounded ill-defined echogenic structure
along the posterior endometrium in the upper uterus. It is difficult
to determine whether this is within the endometrial canal or within
the myometrium. This may represent a submucosal fibroid, or blood
product within the endometrium. Retained product of conception is
not excluded. Correlation with clinical exam and serial HCG levels
recommended.

The ovaries are not visualized.

No significant free fluid in the pelvis.
IMPRESSION: No intrauterine pregnancy identified and no adnexal masses noted.
Findings consistent with pregnancy of unknown location and
differential diagnosis includes: An early IUP, recent spontaneous
miscarriage, or an occult ectopic pregnancy. The constellation of
findings however suggest probable recent spontaneous miscarriage
with blood product within the endometrium. Retained product of
conception is not excluded. Clinical correlation is recommended.

## 2021-10-02 SURGERY — DILATION AND EVACUATION, UTERUS
Anesthesia: General

## 2021-10-02 MED ORDER — HYDROMORPHONE HCL 1 MG/ML IJ SOLN: 0.2500 mg | INTRAMUSCULAR | Status: DC | PRN

## 2021-10-02 MED ORDER — SILVER NITRATE-POT NITRATE 75-25 % EX MISC
CUTANEOUS | Status: AC
  Filled 2021-10-02: qty 10

## 2021-10-02 MED ORDER — LIDOCAINE HCL (CARDIAC) PF 100 MG/5ML IV SOSY
PREFILLED_SYRINGE | INTRAVENOUS | Status: DC | PRN
  Administered 2021-10-02: 100 mg via INTRAVENOUS

## 2021-10-02 MED ORDER — DROPERIDOL 2.5 MG/ML IJ SOLN
0.6250 mg | Freq: Once | INTRAMUSCULAR | Status: DC | PRN
  Filled 2021-10-02: qty 2

## 2021-10-02 MED ORDER — ONDANSETRON HCL 4 MG/2ML IJ SOLN
4.0000 mg | Freq: Once | INTRAMUSCULAR | Status: AC | PRN
  Administered 2021-10-02: 22:00:00 4 mg via INTRAVENOUS

## 2021-10-02 MED ORDER — FENTANYL CITRATE (PF) 100 MCG/2ML IJ SOLN
INTRAMUSCULAR | Status: AC
  Filled 2021-10-02: qty 2

## 2021-10-02 MED ORDER — ROCURONIUM BROMIDE 100 MG/10ML IV SOLN
INTRAVENOUS | Status: DC | PRN
  Administered 2021-10-02: 5 mg via INTRAVENOUS

## 2021-10-02 MED ORDER — TRANEXAMIC ACID-NACL 1000-0.7 MG/100ML-% IV SOLN
1000.0000 mg | Freq: Once | INTRAVENOUS | Status: DC
  Filled 2021-10-02: qty 100

## 2021-10-02 MED ORDER — DOXYCYCLINE HYCLATE 100 MG IV SOLR
INTRAVENOUS | Status: DC | PRN
  Administered 2021-10-02: 22:00:00 200 mg via INTRAVENOUS

## 2021-10-02 MED ORDER — MORPHINE SULFATE (PF) 4 MG/ML IV SOLN
4.0000 mg | Freq: Once | INTRAVENOUS | Status: AC
  Administered 2021-10-02: 20:00:00 4 mg via INTRAVENOUS
  Filled 2021-10-02: qty 1

## 2021-10-02 MED ORDER — FENTANYL CITRATE (PF) 100 MCG/2ML IJ SOLN
INTRAMUSCULAR | Status: DC | PRN
  Administered 2021-10-02: 100 ug via INTRAVENOUS
  Administered 2021-10-02: 50 ug via INTRAVENOUS

## 2021-10-02 MED ORDER — SUCCINYLCHOLINE CHLORIDE 200 MG/10ML IV SOSY
PREFILLED_SYRINGE | INTRAVENOUS | Status: DC | PRN
  Administered 2021-10-02: 200 mg via INTRAVENOUS

## 2021-10-02 MED ORDER — DEXAMETHASONE SODIUM PHOSPHATE 10 MG/ML IJ SOLN
INTRAMUSCULAR | Status: AC
  Filled 2021-10-02: qty 1

## 2021-10-02 MED ORDER — MENTHOL 3 MG MT LOZG
1.0000 | LOZENGE | OROMUCOSAL | Status: DC | PRN
  Filled 2021-10-02: qty 9

## 2021-10-02 MED ORDER — HYDROMORPHONE HCL 1 MG/ML IJ SOLN: 0.2000 mg | INTRAMUSCULAR | Status: DC | PRN

## 2021-10-02 MED ORDER — ONDANSETRON HCL 4 MG PO TABS: 4.0000 mg | ORAL_TABLET | Freq: Four times a day (QID) | ORAL | Status: DC | PRN

## 2021-10-02 MED ORDER — LACTATED RINGERS IV SOLN: INTRAVENOUS | Status: DC

## 2021-10-02 MED ORDER — ONDANSETRON HCL 4 MG/2ML IJ SOLN
INTRAMUSCULAR | Status: AC
  Filled 2021-10-02: qty 2

## 2021-10-02 MED ORDER — LACTATED RINGERS IV SOLN: INTRAVENOUS | Status: DC | PRN

## 2021-10-02 MED ORDER — SIMETHICONE 80 MG PO CHEW: 80.0000 mg | CHEWABLE_TABLET | Freq: Four times a day (QID) | ORAL | Status: DC | PRN

## 2021-10-02 MED ORDER — MIDAZOLAM HCL 2 MG/2ML IJ SOLN
INTRAMUSCULAR | Status: AC
  Filled 2021-10-02: qty 2

## 2021-10-02 MED ORDER — DEXAMETHASONE SODIUM PHOSPHATE 10 MG/ML IJ SOLN
INTRAMUSCULAR | Status: DC | PRN
Start: 2021-10-02 — End: 2021-10-02
  Administered 2021-10-02: 10 mg via INTRAVENOUS

## 2021-10-02 MED ORDER — PROPOFOL 10 MG/ML IV BOLUS
INTRAVENOUS | Status: AC
  Filled 2021-10-02: qty 40

## 2021-10-02 MED ORDER — DOCUSATE SODIUM 100 MG PO CAPS: 100.0000 mg | ORAL_CAPSULE | Freq: Two times a day (BID) | ORAL | Status: DC

## 2021-10-02 MED ORDER — MIDAZOLAM HCL 2 MG/2ML IJ SOLN
INTRAMUSCULAR | Status: DC | PRN
  Administered 2021-10-02: 2 mg via INTRAVENOUS

## 2021-10-02 MED ORDER — ONDANSETRON HCL 4 MG/2ML IJ SOLN
INTRAMUSCULAR | Status: DC | PRN
  Administered 2021-10-02: 4 mg via INTRAVENOUS

## 2021-10-02 MED ORDER — ONDANSETRON HCL 4 MG/2ML IJ SOLN
4.0000 mg | Freq: Once | INTRAMUSCULAR | Status: AC
  Administered 2021-10-02: 20:00:00 4 mg via INTRAVENOUS
  Filled 2021-10-02: qty 2

## 2021-10-02 MED ORDER — PROPOFOL 10 MG/ML IV BOLUS
INTRAVENOUS | Status: DC | PRN
  Administered 2021-10-02: 200 mg via INTRAVENOUS

## 2021-10-02 MED ORDER — SODIUM CHLORIDE 0.9 % IV BOLUS
1000.0000 mL | Freq: Once | INTRAVENOUS | Status: AC
  Administered 2021-10-02: 19:00:00 1000 mL via INTRAVENOUS

## 2021-10-02 MED ORDER — HYDROCODONE-ACETAMINOPHEN 5-325 MG PO TABS
1.0000 | ORAL_TABLET | ORAL | Status: DC | PRN
  Administered 2021-10-03 (×2): 1 via ORAL
  Filled 2021-10-02 (×2): qty 1

## 2021-10-02 MED ORDER — ONDANSETRON HCL 4 MG/2ML IJ SOLN: 4.0000 mg | Freq: Four times a day (QID) | INTRAMUSCULAR | Status: DC | PRN

## 2021-10-02 MED ORDER — PHENYLEPHRINE HCL (PRESSORS) 10 MG/ML IV SOLN
INTRAVENOUS | Status: DC | PRN
  Administered 2021-10-02: 200 ug via INTRAVENOUS
  Administered 2021-10-02: 100 ug via INTRAVENOUS
  Administered 2021-10-02 (×2): 200 ug via INTRAVENOUS
  Administered 2021-10-02: 100 ug via INTRAVENOUS
  Administered 2021-10-02: 200 ug via INTRAVENOUS

## 2021-10-02 MED ORDER — MEPERIDINE HCL 25 MG/ML IJ SOLN: 6.2500 mg | INTRAMUSCULAR | Status: DC | PRN

## 2021-10-02 SURGICAL SUPPLY — 23 items
BAG URINE DRAIN 2000ML AR STRL (UROLOGICAL SUPPLIES) IMPLANT
CATH FOLEY 2WAY  5CC 16FR (CATHETERS)
CATH URTH 16FR FL 2W BLN LF (CATHETERS) IMPLANT
FILTER UTR ASPR SPEC (MISCELLANEOUS) ×1 IMPLANT
FLTR UTR ASPR SPEC (MISCELLANEOUS) ×3
GAUZE 4X4 16PLY ~~LOC~~+RFID DBL (SPONGE) ×3 IMPLANT
GLOVE SURG ENC MOIS LTX SZ7 (GLOVE) ×3 IMPLANT
GOWN STRL REUS W/ TWL LRG LVL3 (GOWN DISPOSABLE) ×2 IMPLANT
GOWN STRL REUS W/TWL LRG LVL3 (GOWN DISPOSABLE) ×4
KIT BERKELEY 1ST TRIMESTER 3/8 (MISCELLANEOUS) ×3 IMPLANT
KIT TURNOVER CYSTO (KITS) ×3 IMPLANT
MANIFOLD NEPTUNE II (INSTRUMENTS) ×3 IMPLANT
NS IRRIG 500ML POUR BTL (IV SOLUTION) ×3 IMPLANT
PACK DNC HYST (MISCELLANEOUS) ×3 IMPLANT
PAD OB MATERNITY 4.3X12.25 (PERSONAL CARE ITEMS) ×3 IMPLANT
PAD PREP 24X41 OB/GYN DISP (PERSONAL CARE ITEMS) ×3 IMPLANT
SCRUB EXIDINE 4% CHG 4OZ (MISCELLANEOUS) ×3 IMPLANT
SET BERKELEY SUCTION TUBING (SUCTIONS) ×3 IMPLANT
TOWEL OR 17X26 4PK STRL BLUE (TOWEL DISPOSABLE) ×3 IMPLANT
VACURETTE 10 RIGID CVD (CANNULA) IMPLANT
VACURETTE 12 RIGID CVD (CANNULA) IMPLANT
VACURETTE 8 RIGID CVD (CANNULA) IMPLANT
WATER STERILE IRR 500ML POUR (IV SOLUTION) ×3 IMPLANT

## 2021-10-02 NOTE — Anesthesia Procedure Notes (Signed)
Procedure Name: Intubation Date/Time: 10/02/2021 9:13 PM Performed by: Esaw Grandchild, CRNA Pre-anesthesia Checklist: Patient identified, Emergency Drugs available, Suction available and Patient being monitored Patient Re-evaluated:Patient Re-evaluated prior to induction Oxygen Delivery Method: Circle system utilized Preoxygenation: Pre-oxygenation with 100% oxygen Induction Type: IV induction and Rapid sequence Ventilation: Mask ventilation without difficulty Laryngoscope Size: Miller and 2 Grade View: Grade I Tube type: Oral Tube size: 7.0 mm Number of attempts: 1 Airway Equipment and Method: Stylet, Oral airway, LTA kit utilized and Bite block Placement Confirmation: ETT inserted through vocal cords under direct vision, positive ETCO2 and breath sounds checked- equal and bilateral Secured at: 23 cm Tube secured with: Tape Dental Injury: Teeth and Oropharynx as per pre-operative assessment

## 2021-10-02 NOTE — ED Notes (Signed)
Second unit of emergency blood started at 1912 and finished at [facility]

## 2021-10-02 NOTE — Anesthesia Preprocedure Evaluation (Addendum)
Anesthesia Evaluation  Patient identified by MRN, date of birth, ID band Patient awake    Reviewed: Allergy & Precautions, NPO status   History of Anesthesia Complications Negative for: history of anesthetic complications  Airway Mallampati: II  TM Distance: >3 FB Neck ROM: Full    Dental no notable dental hx.    Pulmonary Current SmokerPatient did not abstain from smoking.,    Pulmonary exam normal        Cardiovascular negative cardio ROS Normal cardiovascular exam     Neuro/Psych PSYCHIATRIC DISORDERS Anxiety Depression    GI/Hepatic negative GI ROS,   Endo/Other  diabetes  Renal/GU negative Renal ROS     Musculoskeletal   Abdominal   Peds  Hematology  (+) Blood dyscrasia, anemia ,   Anesthesia Other Findings Missed ab-acute hemmorhage-s/p 2 units PRBCs in ED--doing ok,   Marijuana and cocaine abuse  Morbid obesity  Reproductive/Obstetrics (+) Pregnancy                            Anesthesia Physical Anesthesia Plan  ASA: 3 and emergent  Anesthesia Plan: General   Post-op Pain Management:    Induction:   PONV Risk Score and Plan: 3 and Ondansetron, Dexamethasone and Midazolam  Airway Management Planned: Oral ETT  Additional Equipment:   Intra-op Plan:   Post-operative Plan: Extubation in OR  Informed Consent: I have reviewed the patients History and Physical, chart, labs and discussed the procedure including the risks, benefits and alternatives for the proposed anesthesia with the patient or authorized representative who has indicated his/her understanding and acceptance.       Plan Discussed with: CRNA  Anesthesia Plan Comments:         Anesthesia Quick Evaluation

## 2021-10-02 NOTE — Discharge Summary (Signed)
DC Summary Discharge Summary   Patient ID: Tammy Mendez [id number] 25 y.o. 13-Jan-1996  Admit date: 10/02/2021  Discharge date: 10/03/2021  Principal Diagnoses:  Excessive hemorrhage following incomplete spontaneous abortion  Secondary Diagnoses:  Acute blood loss anemia secondary to spontaneous abortion  Procedures performed during the hospitalization:  1) suction, dilation and curettage 2) administration of IV fluid and packed red blood cell administration  HPI: Tammy Mendez is a 25 y.o. [id number] female seen at the request of Arnaldo Natal, MD, for evaluation of heavy vaginal bleeding in the setting of early pregnancy.   The patient states that she had her last menstrual period at the beginning of September. However, she is not completely sure.  She had a positive pregnancy test sometime before halloween.  She has not yet established care.  She began having spotting a few days ago. However, this afternoon she began to bleed very heavily. She states that she  could not even get off the toilet due to heavy bleeding for two hours.  She continued to bleed. So, she called EMS and came to the ER.  In the ER she passed a large amount of blood in the toilet hat.  Her vital signs became  concerning and so she was given an IV fluid bolus and has received 2 units of pRBCs.  She believes that she continues to bleed.  Notably, she used cocaine two days ago because, due to spotting, she thought she was going to lose the baby.  Her pregnancy history is notable for two vaginal deliveries and one EAB.  Her pregnancies have been complicated by gestational diabetes and blood pressure issues in her pregnancy.    Past Medical History:  Diagnosis Date   Anxiety and depression    Gestational diabetes     Past Surgical History:  Procedure Laterality Date   CHOLECYSTECTOMY     DILATION AND EVACUATION N/A 10/02/2021   Procedure: DILATATION AND EVACUATION;  Surgeon: Conard Novak, MD;  Location: [facility]  ORS;  Service: Gynecology;  Laterality: N/A;    Allergies  Allergen Reactions   Latex Rash    Social History   Tobacco Use   Smoking status: Never   Smokeless tobacco: Never  Substance Use Topics   Alcohol use: Yes    Comment: Occ   Drug use: Not Currently    Family History:  maternal grandfather with heart disease and unspecified cancer type, her father has hypertension  Hospital Course:  The patient presented to the emergency room where she received IV fluid and 2 units of packed red blood cells. She was taken to the OR for an emergent suction, dilation and curettage, which occurred without incident. She was monitored overnight. Her repeat blood counts were stable in the morning and her bleeding was minimal. She was ambulating, voiding, tolerating PO, had adequate pain control. She was deemed stable for discharge. She was discharged with progesterone-only contraception pills until she was more remote from delivery.    Discharge Exam: BP 123/79 (BP Location: Left Arm)   Pulse (!) 102   Temp 98.4 F (36.9 C) (Oral)   Resp 18   Ht 5\' 10"  (1.778 m)   Wt (!) 158.2 kg   SpO2 98%   BMI 50.05 kg/m  Physical Exam Constitutional:      General: She is not in acute distress.    Appearance: Normal appearance. She is well-developed.  HENT:     Head: Normocephalic and atraumatic.  Eyes:     General:  No scleral icterus.    Conjunctiva/sclera: Conjunctivae normal.  Cardiovascular:     Rate and Rhythm: Normal rate and regular rhythm.     Heart sounds: No murmur heard.   No friction rub. No gallop.  Pulmonary:     Effort: Pulmonary effort is normal. No respiratory distress.     Breath sounds: Normal breath sounds. No wheezing or rales.  Abdominal:     General: Bowel sounds are normal. There is no distension.     Palpations: Abdomen is soft. There is no mass.     Tenderness: There is no abdominal tenderness. There is no guarding or rebound.  Musculoskeletal:        General:  Normal range of motion.     Cervical back: Normal range of motion and neck supple.  Neurological:     General: No focal deficit present.     Mental Status: She is alert and oriented to [place], place, and time.     Cranial Nerves: No cranial nerve deficit.  Skin:    General: Skin is warm and dry.     Findings: No erythema.  Psychiatric:        Mood and Affect: Mood normal.        Behavior: Behavior normal.        Judgment: Judgment normal.     Condition at Discharge: Stable  Complications affecting treatment: None  Discharge Medications:  Allergies as of 10/03/2021       Reactions   Latex Rash        Medication List     STOP taking these medications    acetaminophen 500 MG tablet Commonly known as: TYLENOL       TAKE these medications    ibuprofen 600 MG tablet Commonly known as: ADVIL Take 1 tablet (600 mg total) by mouth every 6 (six) hours as needed for mild pain or moderate pain.   norethindrone 0.35 MG tablet Commonly known as: MICRONOR Take 1 tablet (0.35 mg total) by mouth daily.         Follow-up arrangements:   Follow-up Information     [facility] Follow up in 2 week(s).   Specialty: Obstetrics and Gynecology Why: Post-op follow up with MD Contact information: [address] [place] [address] [phone]                 Discharge Disposition: Discharge disposition: 01-Home or Self Care    Signed: Thomasene Mohair, MD  10/03/2021 8:39 AM

## 2021-10-02 NOTE — Transfer of Care (Signed)
Immediate Anesthesia Transfer of Care Note  Patient: Tammy Mendez  Procedure(s) Performed: DILATATION AND EVACUATION  Patient Location: PACU  Anesthesia Type:General  Level of Consciousness: awake, alert  and oriented  Airway & Oxygen Therapy: Patient Spontanous Breathing and Patient connected to face mask oxygen  Post-op Assessment: Report given to RN and Post -op Vital signs reviewed and stable  Post vital signs: Reviewed and stable  Last Vitals:  Vitals Value Taken Time  BP 126/104 10/02/21 2201  Temp 36.3 C 10/02/21 2200  Pulse 115 10/02/21 2214  Resp 25 10/02/21 2214  SpO2 98 % 10/02/21 2214    Last Pain:  Vitals:   10/02/21 2200  PainSc: Asleep         Complications: No notable events documented.

## 2021-10-02 NOTE — H&P (Signed)
GYNECOLOGY ADMISSION HISTORY AND PHYSICAL NOTE    Attending Provider: Arnaldo Natal, MD   Toyia Jelinek [phone] 10/02/2021 8:19 PM    Chief Complaint:   Tammy Mendez is a 25 y.o. [phone] female seen at the request of Arnaldo Natal, MD, for evaluation of heavy vaginal bleeding in the setting of early pregnancy.    History of Present Ilness:   The patient states that she had her last menstrual period at the beginning of September. However, she is not completely sure.  She had a positive pregnancy test sometime before halloween.  She has not yet established care.  She began having spotting a few days ago. However, this afternoon she began to bleed very heavily. She states that she  could not even get off the toilet due to heavy bleeding for two hours.  She continued to bleed. So, she called EMS and came to the ER.  In the ER she passed a large amount of blood in the toilet hat.  Her vital signs became  concerning and so she was given an IV fluid bolus and has received 2 units of pRBCs.  She believes that she continues to bleed.  Notably, she used cocaine two days ago because, due to spotting, she thought she was going to lose the baby.  Her pregnancy history is notable for two vaginal deliveries and one EAB.  Her pregnancies have been complicated by gestational diabetes and blood pressure issues in her pregnancy.     Past Medical History:  Diagnosis Date   Anxiety and depression    Gestational diabetes    Past Surgical History:  Procedure Laterality Date   CHOLECYSTECTOMY     Allergies  Allergen Reactions   Latex Rash   Prior to Admission medications   Medication Sig Start Date End Date Taking? Authorizing Provider  acetaminophen (TYLENOL) 500 MG tablet Take 500-1,000 mg by mouth every 6 (six) hours as needed for mild pain or moderate pain.   Yes [provider]    Obstetric History: She is a [id number] female s/p SVD x 2, EAB x 1.    Social History:  She  reports that  she has never smoked. She has never used smokeless tobacco. She reports current alcohol use. She reports that she does not currently use drugs.  Family History:  maternal grandfather with heart disease and unspecified cancer type, her father has hypertension  Review of Systems  Constitutional: Negative.   HENT: Negative.    Eyes: Negative.   Respiratory: Negative.    Cardiovascular: Negative.   Gastrointestinal:  Positive for abdominal pain. Negative for blood in stool, constipation, diarrhea, heartburn, melena, nausea and vomiting.  Genitourinary: Negative.        See HPI  Musculoskeletal: Negative.   Skin: Negative.   Neurological: Negative.   Psychiatric/Behavioral:  Positive for substance abuse. Negative for depression, hallucinations, memory loss and suicidal ideas. The patient is not nervous/anxious and does not have insomnia.     Objective    BP 126/74   Pulse 99   Resp (!) 26   SpO2 100%  Physical Exam Constitutional:      General: She is not in acute distress.    Appearance: Normal appearance. She is well-developed. She is obese.  Genitourinary:     Genitourinary Comments: Deferred at the moment  HENT:     Head: Normocephalic and atraumatic.  Eyes:     General: No scleral icterus.    Conjunctiva/sclera: Conjunctivae normal.  Cardiovascular:  Rate and Rhythm: Regular rhythm. Tachycardia present.     Heart sounds: No murmur heard.   No friction rub. No gallop.  Pulmonary:     Effort: Pulmonary effort is normal. No respiratory distress.     Breath sounds: Normal breath sounds. No wheezing or rales.  Abdominal:     General: Bowel sounds are normal. There is no distension.     Palpations: Abdomen is soft. There is no mass.     Tenderness: There is no abdominal tenderness. There is no guarding or rebound.     Comments: Habitus impedes detailed exam  Musculoskeletal:        General: Normal range of motion.     Cervical back: Normal range of motion and neck  supple.  Neurological:     General: No focal deficit present.     Mental Status: She is alert and oriented to [place], place, and time.     Cranial Nerves: No cranial nerve deficit.  Skin:    General: Skin is warm and dry.     Findings: No erythema.  Psychiatric:        Mood and Affect: Mood normal.        Behavior: Behavior normal.        Judgment: Judgment normal.     Laboratory Results:   Lab Results  Component Value Date   WBC 19.2 (H) 10/02/2021   RBC 4.36 10/02/2021   HGB 9.3 (L) 10/02/2021   HCT 31.1 (L) 10/02/2021   PLT 435 (H) 10/02/2021   NA 133 (L) 10/02/2021   K 3.6 10/02/2021   CREATININE 0.59 10/02/2021   Lab Results  Component Value Date   PREGTESTUR POSITIVE (A) 04/14/2020    Imaging Results:  Pelvic ultrasound pending.  My quick read of the images shows a uterus that is anteverted with heterogeneous material within the endometrial and endocervical canals. Within the endometrial canal the thickness measures 2.9 cm with areas of cystic and solid material. Within the endocervix there is a cystic vs fluid area.  The adnexa are not well visualized and there appears to be no free fluid within the abdomen.  Assessment & Plan   Tammy Mendez is a 25 y.o. [phone] female with an early pregnancy loss with an associated hemorrhage requiring IV fluid and packed red cell transfusions for resuscitation.   Plan:  1.  Given her strong requirements for resuscitation in the ER, will take to the OR emergently for suction, dilation and curettage.  Discussed the risks of the procedure, which include, but are not limited to; bleeding, infection, uterine perforation with resultant risk of damage to organs that include bowel, bladder, nerves, and blood vessels.  We also discussed the risks of anesthesia briefly.  We discussed that her recent use of cocaine may make her at greater risk for coronary artery vasospasm under general anesthesia.  I have alerted the OR to her recent cocaine  use.   2.  Continue to monitor after surgery for bleeding. Likely to be discharged in the AM pending clinical stability.  Thomasene Mohair, MD 10/02/2021 8:19 PM

## 2021-10-02 NOTE — ED Notes (Signed)
RN spoke to [place],RN and went to OR.

## 2021-10-02 NOTE — ED Provider Notes (Addendum)
[facility] Emergency Department Provider Note   ____________________________________________   Event Date/Time   First MD Initiated Contact with Patient 10/02/21 1932     (approximate)  I have reviewed the triage vital signs and the nursing notes.   HISTORY  Chief Complaint Vaginal Bleeding  HPI Tammy Mendez is a 25 y.o. female who came to the emergency room today for vaginal bleeding.  I was in the hallway of the triage area assisting in placing a feeding tube when we heard someone in the bathroom calling for help.  We opened the door and found the lady sitting on the toilet who was very pale with her head rolling back on her shoulders saying help me in a very faint voice and going to pass out.  She did look very pale we noticed a lot of blood on her legs and underwear and she was sitting on the toilet.  The PA and I were able to assist her off the toilet back onto the wheelchair and then onto a empty stretcher in the hallway of the triage area.  She had filled up the taxes hat that was in the toilet completely with blood and clots.  She was saying she felt lightheaded and indeed was acting like she was on the verge of passing out.  We put her in Trendelenburg and brought her first to the hallway by bed 13 where we were able to get a second IV in her and begin giving her fluids and we moved her into room to in using the [place] gave her 2 units of type O- emergency release blood as she was tachycardic with a blood pressure of 101 at the lowest I saw.  This was while she was in Trendelenburg.  I have ordered massive transfusion protocol we have 1 unit of FFP available currently and she got platelets already.  Ultrasound came to evaluate her and found no free fluid in the abdomen but a thickened endometrium was still with active bleeding in the uterus.  She was unable to identify either ovary but again there was no free fluid in the pelvis.  Dr. Jean Rosenthal who is on-call for  OB was contacted and came in immediately.  TXA was considered but since it developed the patient has been bleeding heavily since yesterday and very heavily today all day long.  We decided not to give TXA.        Past Medical History:  Diagnosis Date   Gestational diabetes     There are no problems to display for this patient.   Past Surgical History:  Procedure Laterality Date   CHOLECYSTECTOMY      Prior to Admission medications   Medication Sig Start Date End Date Taking? Authorizing Provider  ferrous gluconate (IRON 27) 240 (27 FE) MG tablet Take 1 tablet (240 mg total) by mouth 3 (three) times daily with meals for 7 days. 02/01/21 02/08/21  Gilles Chiquito, MD  medroxyPROGESTERone (PROVERA) 5 MG tablet Take 4 tablets (20 mg total) by mouth in the morning, at noon, and at bedtime for 7 days. 02/01/21 02/08/21  Gilles Chiquito, MD    Allergies Latex  No family history on file.  Social History Social History   Tobacco Use   Smoking status: Never   Smokeless tobacco: Never  Substance Use Topics   Alcohol use: Yes    Comment: Occ   Drug use: Not Currently    Review of Systems  Review of systems not  attempted due to patient acuity  ____________________________________________   PHYSICAL EXAM:  VITAL SIGNS: ED Triage Vitals [10/02/21 1915]  Enc Vitals Group     BP 121/72     Pulse Rate 99     Resp (!) 22     Temp      Temp src      SpO2 100 %     Weight      Height      Head Circumference      Peak Flow      Pain Score      Pain Loc      Pain Edu?      Excl. in GC?     Constitutional: Pale sweaty and groggy Eyes: Conjunctivae are pale Head: Atraumatic. Nose: No congestion/rhinnorhea. Mouth/Throat: Mucous membranes are pale Neck: No stridor.  Cardiovascular: Rapid rate, regular rhythm. Grossly normal heart sounds.  Good peripheral circulation. Respiratory: Normal respiratory effort.  No retractions. Lungs CTAB. Gastrointestinal: Soft a tender lower  abdomen no distention. No abdominal bruits. Genitourinary: Dried blood and active bleeding externally internal exam not attempted Musculoskeletal: Dried blood running down the legs Neurologic: Somewhat groggy and very unsteady unable to walk by herself Skin:  Skin is clammy and pale   ____________________________________________   LABS (all labs ordered are listed, but only abnormal results are displayed)  Labs Reviewed  COMPREHENSIVE METABOLIC PANEL - Abnormal; Notable for the following components:      Result Value   Sodium 133 (*)    Glucose, Bld 138 (*)    Calcium 8.8 (*)    Albumin 3.4 (*)    All other components within normal limits  CBC WITH DIFFERENTIAL/PLATELET - Abnormal; Notable for the following components:   WBC 19.2 (*)    Hemoglobin 9.3 (*)    HCT 31.1 (*)    MCV 71.3 (*)    MCH 21.3 (*)    MCHC 29.9 (*)    RDW 16.9 (*)    Platelets 435 (*)    Neutro Abs 13.1 (*)    Lymphs Abs 4.2 (*)    Monocytes Absolute 1.3 (*)    Abs Immature Granulocytes 0.10 (*)    All other components within normal limits  POC/TISSUE MICROARRAY  PROTIME-INR  POC URINE PREG, ED  TYPE AND SCREEN  PREPARE RBC (CROSSMATCH)  PREPARE PLATELET PHERESIS  PREPARE FRESH FROZEN PLASMA  SURGICAL PATHOLOGY   ____________________________________________  EKG   ____________________________________________  RADIOLOGY Jill Poling, personally viewed and evaluated these images (plain radiographs) as part of my medical decision making, as well as reviewing the written report by the radiologist.  ED MD interpretation: Ultrasound done by tech while I watched there was no free fluid in the belly there was active bleeding in the uterus with a thickened endometrium no fetus or heart movement was seen.  Official radiology report(s): No results found.  ____________________________________________   PROCEDURES  Procedure(s) performed (including Critical Care): At least 30 minutes of  critical care time starting at the time I assisted her off the toilet and I was with her until Dr. Jean Rosenthal arrived and spoke to Dr. Jean Rosenthal.  I was not away from her except for her to put orders in the computer during that time.  I gave her as I noted 2 units of emergency release blood and platelets and got blood going.  Additionally she got 4 morphine and 4 Zofran IV for her severe cramps after the ultrasound was done.  Procedures I did  get an oral consent for transfusion from the patient prior to transfusing her.  I described the risk factors as infection or getting the wrong blood or allergic reaction but she understood that she had a life-threatening bleed and needed blood to stay alive and also she had had blood transfusion before and said she understood all the risks and benefits so we gave her the emergency release blood.   ____________________________________________   INITIAL IMPRESSION / ASSESSMENT AND PLAN / ED COURSE  Patient with exsanguinating pelvic bleed now stable after transfusion going to the OR before she loses more blood.    ----------------------------------------- 8:12 PM on 10/02/2021 ----------------------------------------- I have spoken to both the patient's husband Dondra Prader at [phone] and the patient's mom at [phone].  Mom is on her way here now.  I have advised her that her daughter is stable but will likely go to the OR very soon and to please drive carefully and come through the ER entrance and asked the triage nurse for directions likely directions to the OR waiting room.          ____________________________________________   FINAL CLINICAL IMPRESSION(S) / ED DIAGNOSES  Final diagnoses:  Vaginal bleeding in pregnancy  Hypotensive episode  Vaginal bleeding     ED Discharge Orders     None        Note:  This document was prepared using Dragon voice recognition software and may include unintentional dictation errors.    Arnaldo Natal, MD 10/02/21 Babette Relic    Arnaldo Natal, MD 10/02/21 Forestine Na    Arnaldo Natal, MD 10/02/21 2012

## 2021-10-02 NOTE — ED Notes (Addendum)
Emergency platelets started at 1916 and finished at 1924.

## 2021-10-02 NOTE — Op Note (Signed)
  Operative Note    Name: Tammy Mendez  Date of Service: 10/02/2021  DOB: 03-28-1996  MRN: [id number]   Pre-Operative Diagnosis:  Excessive hemorrhage following incomplete spontaneous abortion [O03.1]  Post-Operative Diagnosis:  Excessive hemorrhage following incomplete spontaneous abortion [O03.1]  Procedures: Suction, dilation and curettage  Primary Surgeon: Thomasene Mohair, MD   EBL: 100 mL   IVF: 1,000 mL crystalloid fluid  Urine output: 100 mL via in-and-out catheter in OR  Specimens: products of conception  Drains: none  Complications: None   Disposition: PACU   Condition: Stable   Findings: enlarged uterus with products of conception mainly within the cervix and lower uterine segment  Procedure Summary:  After informed consent was confirmed, the patient was taken to the operating room where general anesthesia was induced.  Her legs were carefully placed in Edmonson stirrups.  She was prepped and draped in the standard fashion.  A speculum was placed in the vagina and the cervix was prepped with betadine.   A tenaculum was placed on the anterior lip of the cervix.  The cervix was serially dilated to 7 mm.  The 8 mm suction curette was advanced to the uterine fundus.  Three passes were made with the suction curette with evacuation of adequate tissue noted.  Two passes were made using sharp curettage until a gritty texture was noted.  One final pass was made with the suction curette.  All instruments were removed from the vagina and the cervix was noted to be hemostatic after removal of the tenaculum and application of silver nitrate.  At the end of the procedure, sponge, lap, and needle counts were correct x 2.    The patient tolerated the procedure well.  Sponge, lap, needle, and instrument counts were correct x 2.  VTE prophylaxis: SCDs. Antibiotic prophylaxis: Doxycycline 200 mgIV. She was awakened in the operating room and was taken to the PACU in stable condition.    Thomasene Mohair, MD 10/02/2021 9:49 PM

## 2021-10-02 NOTE — ED Triage Notes (Signed)
Pt presents via EMS c/o vaginal bleeding and lower abd pain. Reports passing clots and soaking bed in bright red blood. HR 130. G3P2, reports + pregnancy test however has not seen OB.

## 2021-10-02 NOTE — ED Notes (Signed)
First unit of emergecy blood started at 1910 and finished at 1911

## 2021-10-02 NOTE — ED Triage Notes (Signed)
See ED triage note at 1817.

## 2021-10-03 ENCOUNTER — Encounter: Payer: Self-pay | Admitting: Obstetrics and Gynecology

## 2021-10-03 LAB — TYPE AND SCREEN
ABO/RH(D): A POS
Antibody Screen: NEGATIVE
Unit division: 0
Unit division: 0

## 2021-10-03 LAB — BPAM RBC
Blood Product Expiration Date: 202212272359
Blood Product Expiration Date: 202212272359
ISSUE DATE / TIME: 202211271908
ISSUE DATE / TIME: 202211271908
Unit Type and Rh: 9500
Unit Type and Rh: 9500

## 2021-10-03 LAB — PREPARE PLATELET PHERESIS: Unit division: 0

## 2021-10-03 LAB — BASIC METABOLIC PANEL
Anion gap: 5 (ref 5–15)
BUN: 10 mg/dL (ref 6–20)
CO2: 26 mmol/L (ref 22–32)
Calcium: 8.9 mg/dL (ref 8.9–10.3)
Chloride: 106 mmol/L (ref 98–111)
Creatinine, Ser: 0.47 mg/dL (ref 0.44–1.00)
GFR, Estimated: >60 mL/min (ref >60)
Glucose, Bld: 132 mg/dL — ABNORMAL HIGH (ref 70–99)
Potassium: 4.3 mmol/L (ref 3.5–5.1)
Sodium: 137 mmol/L (ref 135–145)

## 2021-10-03 LAB — BPAM PLATELET PHERESIS
Blood Product Expiration Date: 202211282359
ISSUE DATE / TIME: 202211271908
Unit Type and Rh: 6200

## 2021-10-03 LAB — CBC
HCT: 29.5 % — ABNORMAL LOW (ref 36.0–46.0)
Hemoglobin: 9.1 g/dL — ABNORMAL LOW (ref 12.0–15.0)
MCH: 22.7 pg — ABNORMAL LOW (ref 26.0–34.0)
MCHC: 30.8 g/dL (ref 30.0–36.0)
MCV: 73.6 fL — ABNORMAL LOW (ref 80.0–100.0)
Platelets: 310 10*3/uL (ref 150–400)
RBC: 4.01 MIL/uL (ref 3.87–5.11)
RDW: 17.8 % — ABNORMAL HIGH (ref 11.5–15.5)
WBC: 14.3 10*3/uL — ABNORMAL HIGH (ref 4.0–10.5)
nRBC: 0 % (ref 0.0–0.2)

## 2021-10-03 LAB — HCG, QUANTITATIVE, PREGNANCY: hCG, Beta Chain, Quant, S: 4388 m[IU]/mL — ABNORMAL HIGH (ref <5)

## 2021-10-03 MED ORDER — INFLUENZA VAC SPLIT QUAD 0.5 ML IM SUSY: 0.5000 mL | PREFILLED_SYRINGE | INTRAMUSCULAR | Status: DC

## 2021-10-03 MED ORDER — IBUPROFEN 600 MG PO TABS
600.0000 mg | ORAL_TABLET | Freq: Four times a day (QID) | ORAL | Status: DC
  Administered 2021-10-03 (×2): 600 mg via ORAL
  Filled 2021-10-03 (×2): qty 1

## 2021-10-03 MED ORDER — IBUPROFEN 600 MG PO TABS: 600.0000 mg | ORAL_TABLET | Freq: Four times a day (QID) | ORAL | 0 refills | Status: DC | PRN

## 2021-10-03 MED ORDER — NORETHINDRONE 0.35 MG PO TABS: 1.0000 | ORAL_TABLET | Freq: Every day | ORAL | 11 refills | Status: DC

## 2021-10-03 NOTE — Anesthesia Postprocedure Evaluation (Signed)
Anesthesia Post Note  Patient: Aracelly Malinowski  Procedure(s) Performed: DILATATION AND EVACUATION  Patient location during evaluation: PACU Anesthesia Type: General Level of consciousness: awake, oriented and awake and alert Pain management: pain level controlled Vital Signs Assessment: vitals unstable and post-procedure vital signs reviewed and stable Respiratory status: spontaneous breathing and respiratory function stable Cardiovascular status: stable Anesthetic complications: no   No notable events documented.   Last Vitals:  Vitals:   10/02/21 2300 10/02/21 2330  BP: (!) 111/46 113/68  Pulse: 98 98  Resp: 16   Temp: 36.8 C 37.1 C  SpO2: 98%     Last Pain:  Vitals:   10/02/21 2330  TempSrc: Oral  PainSc:                  Chere Babson

## 2021-10-04 LAB — SURGICAL PATHOLOGY

## 2022-01-01 ENCOUNTER — Emergency Department
Admission: EM | Admit: 2022-01-01 | Discharge: 2022-01-01 | Disposition: A | Discharge status: Home / Self Care | Payer: Medicaid Other | Attending: Emergency Medicine | Admitting: Emergency Medicine

## 2022-01-01 ENCOUNTER — Emergency Department: Payer: Medicaid Other

## 2022-01-01 ENCOUNTER — Encounter: Payer: Self-pay | Admitting: Emergency Medicine

## 2022-01-01 ENCOUNTER — Other Ambulatory Visit: Payer: Self-pay

## 2022-01-01 DIAGNOSIS — Z3A09 9 weeks gestation of pregnancy: Secondary | ICD-10-CM | POA: Insufficient documentation

## 2022-01-01 DIAGNOSIS — M545 Low back pain, unspecified: Secondary | ICD-10-CM | POA: Insufficient documentation

## 2022-01-01 DIAGNOSIS — R102 Pelvic and perineal pain: Secondary | ICD-10-CM | POA: Insufficient documentation

## 2022-01-01 DIAGNOSIS — O99111 Other diseases of the blood and blood-forming organs and certain disorders involving the immune mechanism complicating pregnancy, first trimester: Secondary | ICD-10-CM | POA: Diagnosis not present

## 2022-01-01 DIAGNOSIS — D72829 Elevated white blood cell count, unspecified: Secondary | ICD-10-CM | POA: Diagnosis not present

## 2022-01-01 DIAGNOSIS — O26891 Other specified pregnancy related conditions, first trimester: Secondary | ICD-10-CM | POA: Diagnosis present

## 2022-01-01 DIAGNOSIS — O99011 Anemia complicating pregnancy, first trimester: Secondary | ICD-10-CM | POA: Diagnosis not present

## 2022-01-01 DIAGNOSIS — Z3491 Encounter for supervision of normal pregnancy, unspecified, first trimester: Secondary | ICD-10-CM

## 2022-01-01 LAB — BASIC METABOLIC PANEL
Anion gap: 10 (ref 5–15)
BUN: 9 mg/dL (ref 6–20)
CO2: 24 mmol/L (ref 22–32)
Calcium: 9.3 mg/dL (ref 8.9–10.3)
Chloride: 100 mmol/L (ref 98–111)
Creatinine, Ser: 0.56 mg/dL (ref 0.44–1.00)
GFR, Estimated: >60 mL/min (ref >60)
Glucose, Bld: 87 mg/dL (ref 70–99)
Potassium: 3.8 mmol/L (ref 3.5–5.1)
Sodium: 134 mmol/L — ABNORMAL LOW (ref 135–145)

## 2022-01-01 LAB — URINALYSIS, COMPLETE (UACMP) WITH MICROSCOPIC
Bilirubin Urine: NEGATIVE
Glucose, UA: NEGATIVE mg/dL
Hgb urine dipstick: NEGATIVE
Ketones, ur: NEGATIVE mg/dL
Nitrite: NEGATIVE
Protein, ur: NEGATIVE mg/dL
Specific Gravity, Urine: 1.006 (ref 1.005–1.030)
pH: 7 (ref 5.0–8.0)

## 2022-01-01 LAB — CHLAMYDIA/NGC RT PCR (ARMC ONLY)
Chlamydia Tr: NOT DETECTED
N gonorrhoeae: NOT DETECTED

## 2022-01-01 LAB — CBC
HCT: 36.5 % (ref 36.0–46.0)
Hemoglobin: 10.5 g/dL — ABNORMAL LOW (ref 12.0–15.0)
MCH: 20.4 pg — ABNORMAL LOW (ref 26.0–34.0)
MCHC: 28.8 g/dL — ABNORMAL LOW (ref 30.0–36.0)
MCV: 70.9 fL — ABNORMAL LOW (ref 80.0–100.0)
Platelets: 371 10*3/uL (ref 150–400)
RBC: 5.15 MIL/uL — ABNORMAL HIGH (ref 3.87–5.11)
RDW: 16.8 % — ABNORMAL HIGH (ref 11.5–15.5)
WBC: 10.6 10*3/uL — ABNORMAL HIGH (ref 4.0–10.5)
nRBC: 0 % (ref 0.0–0.2)

## 2022-01-01 LAB — WET PREP, GENITAL
Sperm: NONE SEEN
Trich, Wet Prep: NONE SEEN
WBC, Wet Prep HPF POC: >=10 — AB (ref <10)
Yeast Wet Prep HPF POC: NONE SEEN

## 2022-01-01 LAB — HCG, QUANTITATIVE, PREGNANCY: hCG, Beta Chain, Quant, S: 52005 m[IU]/mL — ABNORMAL HIGH (ref <5)

## 2022-01-01 LAB — POC URINE PREG, ED: Preg Test, Ur: POSITIVE — AB

## 2022-01-01 IMAGING — US US OB COMP LESS 14 WK
1 series · 14 of 28 positions shown · non-contrast
Comparison: None.

CLINICAL DATA: Pelvic pain in 1st trimester pregnancy for past 3
days. Unsure of LMP.

EXAM:
OBSTETRIC <14 WK ULTRASOUND
TECHNIQUE: Transabdominal ultrasound was performed for evaluation of the
gestation as well as the maternal uterus and adnexal regions.

[Series 1: us ob less than 14 weeks with ob transvaginal · 14 of 84 slices shown]
[im 4/84]
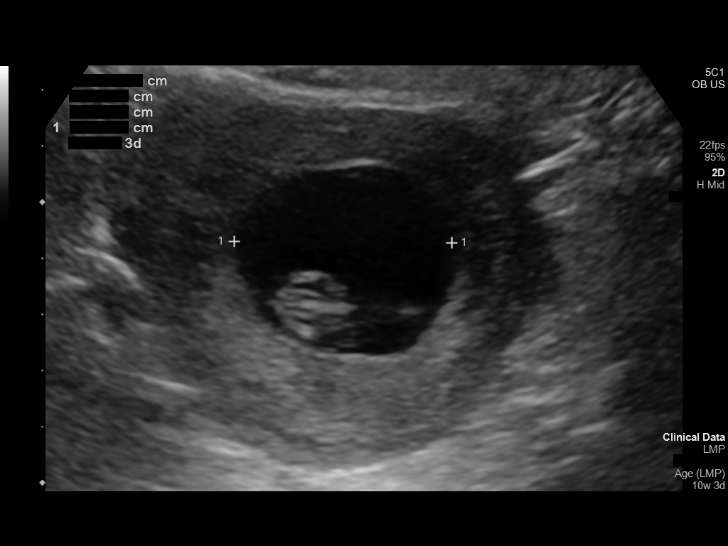
[im 10/84]
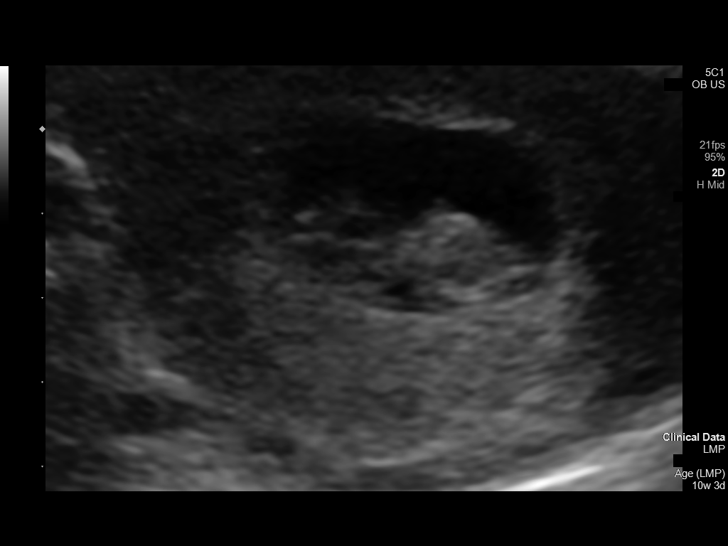
[im 16/84]
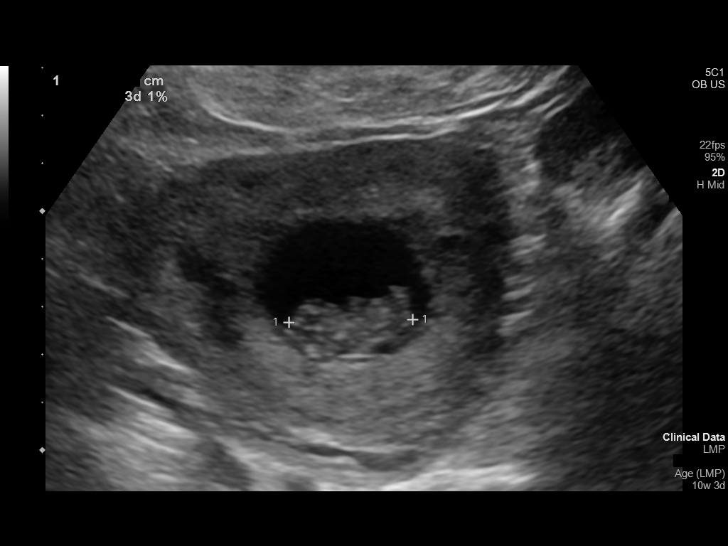
[im 22/84]
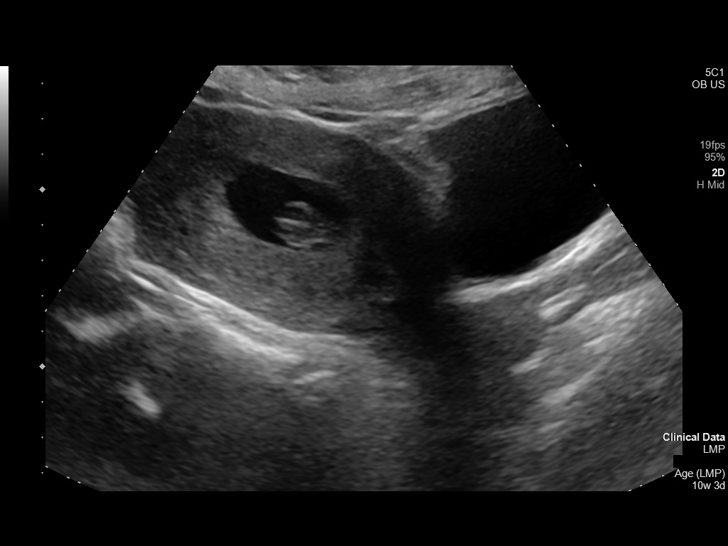
[im 28/84]
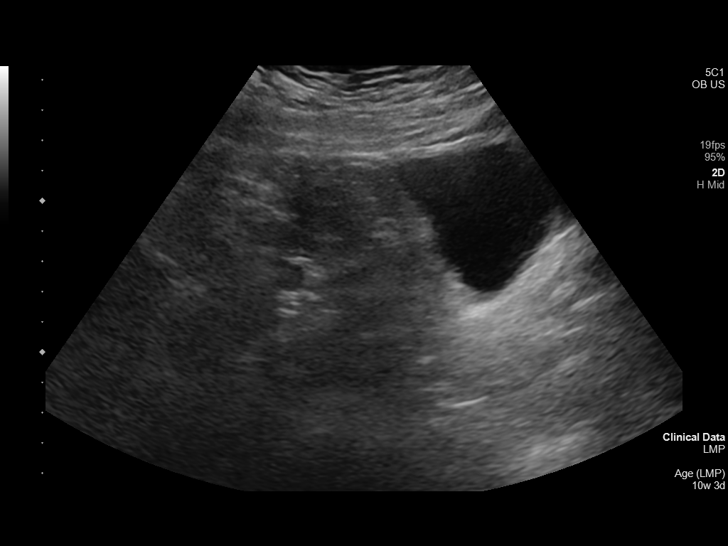
[im 34/84]
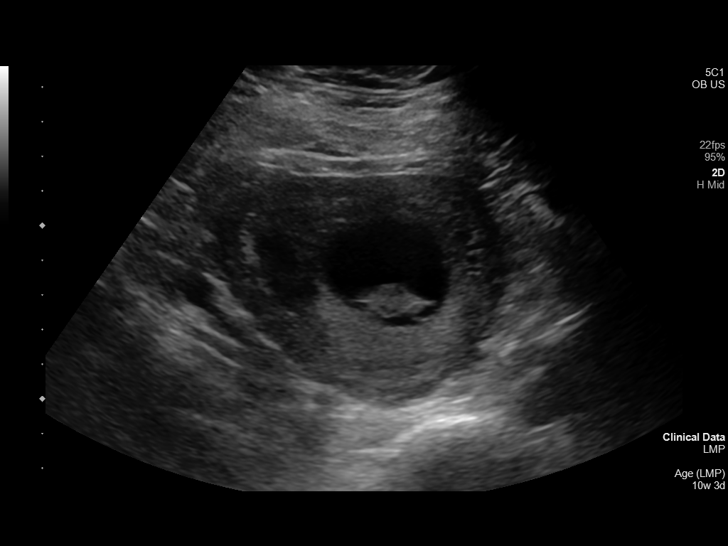
[im 40/84]
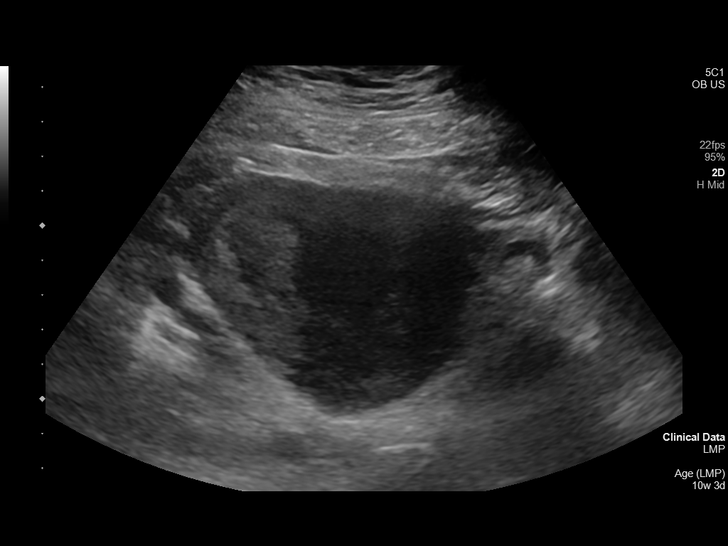
[im 47/84]
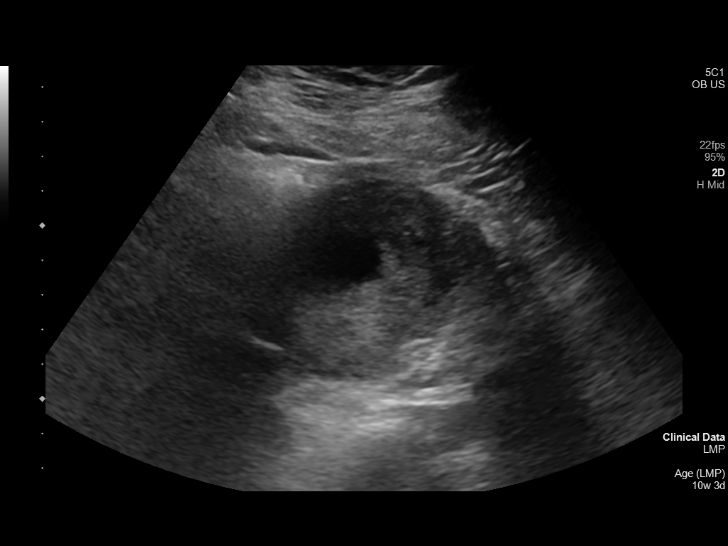
[im 53/84]
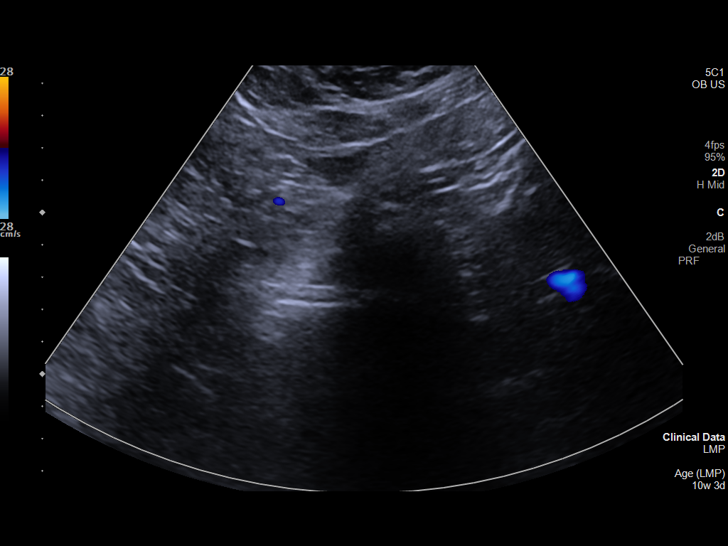
[im 59/84]
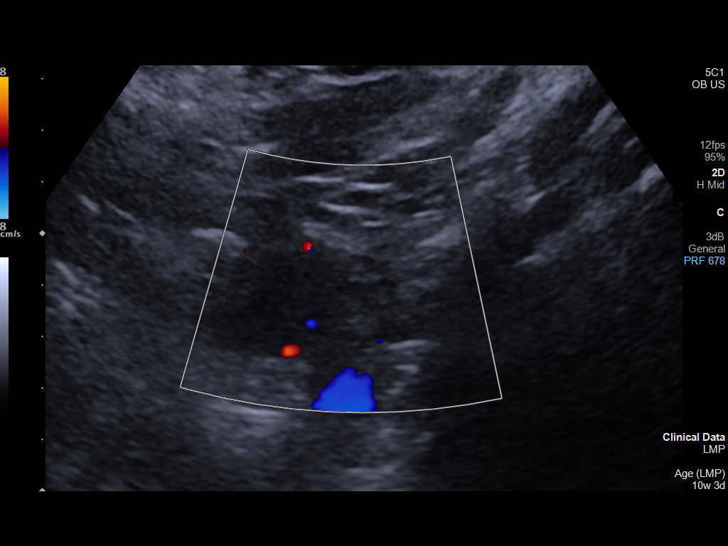
[im 65/84]
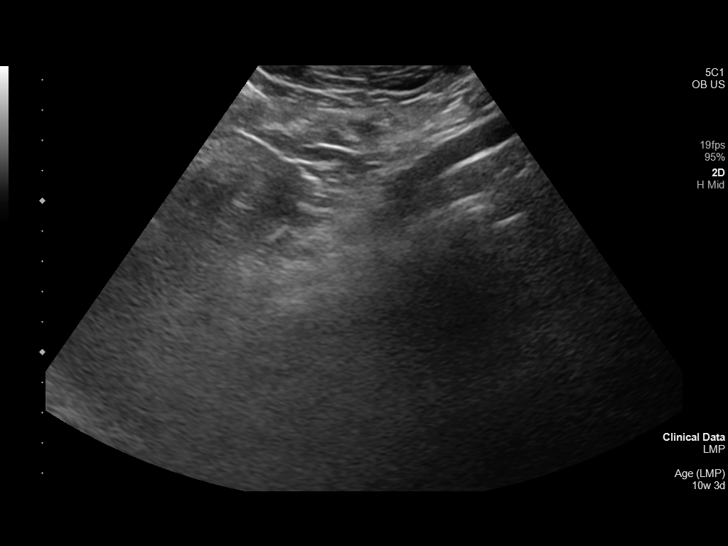
[im 71/84]
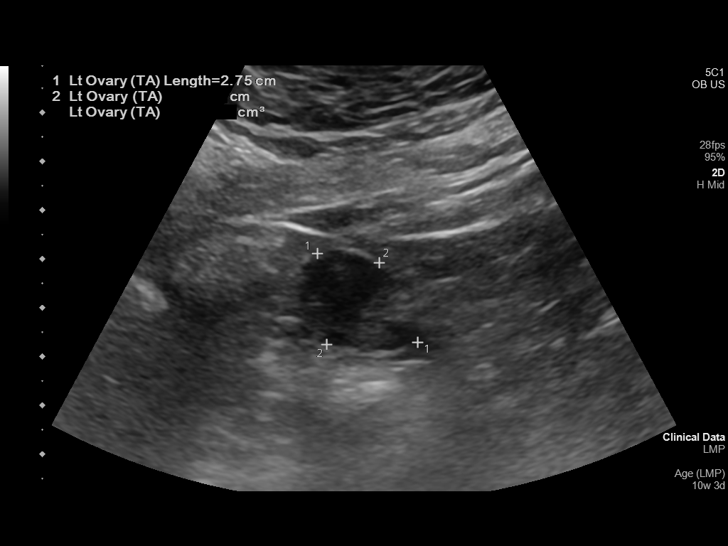
[im 77/84]
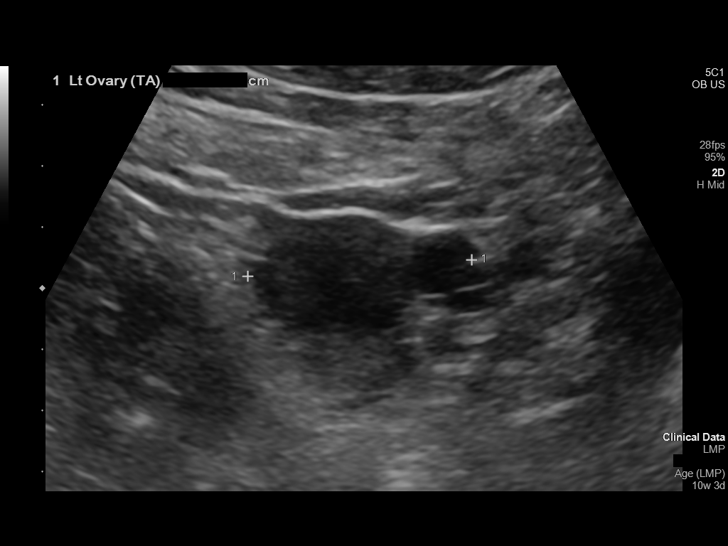
[im 84/84]
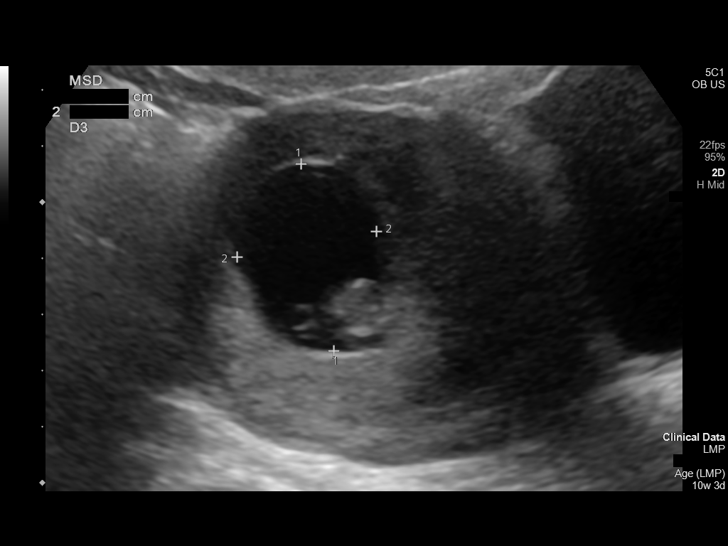

[14 of 28 positions shown; findings below may reference images not displayed]

FINDINGS: Intrauterine gestational sac: Single

Yolk sac:  Visualized.

Embryo:  Visualized.

Cardiac Activity: Visualized.

Heart Rate: 180 bpm

CRL:   26 mm   9 w 2 d                  US EDC: [DATE]

Subchorionic hemorrhage:  None visualized.

Maternal uterus/adnexae: Both ovaries are normal in appearance. No
mass or abnormal free fluid identified.
IMPRESSION: Single living IUP with estimated gestational age of 9 weeks 2 days,
and US EDC of [DATE].

No maternal uterine or adnexal abnormality identified.

## 2022-01-01 MED ORDER — ACETAMINOPHEN 500 MG PO TABS: 1000.0000 mg | ORAL_TABLET | Freq: Four times a day (QID) | ORAL | 2 refills | Status: AC | PRN

## 2022-01-01 MED ORDER — ACETAMINOPHEN 325 MG PO TABS
650.0000 mg | ORAL_TABLET | Freq: Once | ORAL | Status: AC
  Administered 2022-01-01: 650 mg via ORAL
  Filled 2022-01-01: qty 2

## 2022-01-01 MED ORDER — METRONIDAZOLE 0.75 % VA GEL: 1.0000 | Freq: Every day | VAGINAL | 0 refills | Status: DC

## 2022-01-01 NOTE — ED Triage Notes (Signed)
Pt via POV from home. Pt c/o lower back pain for last 3 days. Pt had a D&C in December but now she thinks she may be pregnant, pregnancy has not been confirmed by physician. LMP was sometimes after her D&C in December. Pt does endorse some vaginal d/c denies any vaginal bleeding. Pt is A&OX4 and NAD>

## 2022-01-01 NOTE — Discharge Instructions (Addendum)
[facility] Address: [address] [place], [place] [address] Phone: [phone]  Use the flagyll as prescribed, you cannot take the oral medication due to pregnancy, or you could wait and [facility] oral if you terminate the pregnancy.  US shows that you are 9 weeks and 2 days pregnant  Return emergency department worsening

## 2022-01-01 NOTE — ED Provider Notes (Signed)
[facility] Provider Note    Event Date/Time   First MD Initiated Contact with Patient 01/01/22 1129     (approximate)   History   Back Pain   HPI  Tammy Mendez is a 26 y.o. female presents emergency department with concerns of low back pain and lower abdominal pain.  Patient states she had a D&C in December.  States she just found out she is pregnant again and does not want to have another child.  States she is taking birth control pills but must have missed some.  Patient states that she has not had a fever or chills but has had some discharge.  Patient would like to talk to someone about getting an abortion.      Physical Exam   Triage Vital Signs: ED Triage Vitals  Enc Vitals Group     BP 01/01/22 1034 133/82     Pulse Rate 01/01/22 1028 (!) 118     Resp 01/01/22 1028 20     Temp 01/01/22 1028 98.8 F (37.1 C)     Temp Source 01/01/22 1028 Oral     SpO2 01/01/22 1028 98 %     Weight 01/01/22 1031 (!) 340 lb (154.2 kg)     Height 01/01/22 1031 5\' 10"  (1.778 m)     Head Circumference --      Peak Flow --      Pain Score 01/01/22 1031 9     Pain Loc --      Pain Edu? --      Excl. in [place]? --     Most recent vital signs: Vitals:   01/01/22 1028 01/01/22 1034  BP:  133/82  Pulse: (!) 118   Resp: 20   Temp: 98.8 F (37.1 C)   SpO2: 98%      General: Awake, no distress.   CV:  Good peripheral perfusion. regular rate and  rhythm Resp:  Normal effort. Lungs CTA Abd:  No distention.  Tender in the right and left lower quadrant Other:  Paravertebral muscles tender lumbar spine   ED Results / Procedures / Treatments   Labs (all labs ordered are listed, but only abnormal results are displayed) Labs Reviewed  WET PREP, GENITAL - Abnormal; Notable for the following components:      Result Value   Clue Cells Wet Prep HPF POC PRESENT (*)    WBC, Wet Prep HPF POC >=10 (*)    All other components within normal limits  CBC - Abnormal;  Notable for the following components:   WBC 10.6 (*)    RBC 5.15 (*)    Hemoglobin 10.5 (*)    MCV 70.9 (*)    MCH 20.4 (*)    MCHC 28.8 (*)    RDW 16.8 (*)    All other components within normal limits  BASIC METABOLIC PANEL - Abnormal; Notable for the following components:   Sodium 134 (*)    All other components within normal limits  HCG, QUANTITATIVE, PREGNANCY - Abnormal; Notable for the following components:   hCG, Beta Chain, Quant, S 52,005 (*)    All other components within normal limits  URINALYSIS, COMPLETE (UACMP) WITH MICROSCOPIC - Abnormal; Notable for the following components:   Color, Urine YELLOW (*)    APPearance CLOUDY (*)    Leukocytes,Ua LARGE (*)    Bacteria, UA RARE (*)    All other components within normal limits  POC URINE PREG, ED - Abnormal; Notable for the  following components:   Preg Test, Ur POSITIVE (*)    All other components within normal limits  CHLAMYDIA/NGC RT PCR ([facility] ONLY)               EKG     RADIOLOGY Ultrasound OB less than 14    PROCEDURES:   Procedures   MEDICATIONS ORDERED IN ED: Medications  acetaminophen (TYLENOL) tablet 650 mg (650 mg Oral Given 01/01/22 1158)     IMPRESSION / MDM / ASSESSMENT AND PLAN / ED COURSE  I reviewed the triage vital signs and the nursing notes.                              Differential diagnosis includes, but is not limited to, IUP, ectopic, retained products of conception, lumbar radiculopathy, muscle strain, UTI, pyelonephritis  Patient's urinalysis has a large amount of leuks but rare bacteria, POC pregnancy is positive, beta-hCG is 52,005, basic metabolic panel has sodium of 134, glucose is normal, CBC has a WBC of 10.6 which is minimally elevated, hemoglobin is decreased also at 10.5, however patient did have a large amount of bleeding back in December due to miscarriage.  3 months ago her hemoglobin was 9.1  Ultrasound OB less than 14 weeks was reviewed by me.  Do see a IUP.   Awaiting radiology read  Ultrasound was read by radiologist as IUP of 9 weeks 2 days with a heart rate of 140   These findings to the patient.  She was still like for me to give her the phone number for [facility].  Patient does seem to be a little indecisive about this decision.  Therefore for bacterial vaginosis I feel we should just give her MetroGel instead of oral pills.  She was also given a prescription for Tylenol for her pain in her back.  She is to follow-up with her regular doctor as needed.  Return emergency department if worsening.  Patient states she understands and will return if worsening.    Patient is asking for her discharge papers.  States she has been waiting for 45 minutes for the papers.  I did give her her discharge papers and took her off the board.  She is in stable condition         FINAL CLINICAL IMPRESSION(S) / ED DIAGNOSES   Final diagnoses:  Pelvic pain  First trimester pregnancy  Acute midline low back pain without sciatica     Rx / DC Orders   ED Discharge Orders          Ordered    acetaminophen (TYLENOL) 500 MG tablet  Every 6 hours PRN        01/01/22 1310    metroNIDAZOLE (METROGEL VAGINAL) 0.75 % vaginal gel  Daily at bedtime        01/01/22 1310             Note:  This document was prepared using Dragon voice recognition software and may include unintentional dictation errors.    Faythe Ghee, PA-C 01/01/22 1355    Gilles Chiquito, MD 01/01/22 [phone]

## 2022-03-14 ENCOUNTER — Emergency Department: Payer: Medicaid Other

## 2022-03-14 ENCOUNTER — Other Ambulatory Visit: Payer: Self-pay

## 2022-03-14 ENCOUNTER — Observation Stay
Admission: EM | Admit: 2022-03-14 | Discharge: 2022-03-14 | Disposition: A | Discharge status: Home / Self Care | Payer: Medicaid Other | Attending: Obstetrics and Gynecology | Admitting: Obstetrics and Gynecology

## 2022-03-14 ENCOUNTER — Encounter: Payer: Self-pay | Admitting: Obstetrics and Gynecology

## 2022-03-14 ENCOUNTER — Emergency Department
Admission: EM | Admit: 2022-03-14 | Discharge: 2022-03-14 | Disposition: A | Discharge status: Home / Self Care | Payer: Medicaid Other | Attending: Emergency Medicine | Admitting: Emergency Medicine

## 2022-03-14 ENCOUNTER — Encounter: Payer: Self-pay | Admitting: Emergency Medicine

## 2022-03-14 DIAGNOSIS — R000 Tachycardia, unspecified: Secondary | ICD-10-CM | POA: Diagnosis not present

## 2022-03-14 DIAGNOSIS — Z79899 Other long term (current) drug therapy: Secondary | ICD-10-CM | POA: Diagnosis not present

## 2022-03-14 DIAGNOSIS — M542 Cervicalgia: Secondary | ICD-10-CM | POA: Insufficient documentation

## 2022-03-14 DIAGNOSIS — O99891 Other specified diseases and conditions complicating pregnancy: Secondary | ICD-10-CM | POA: Diagnosis not present

## 2022-03-14 DIAGNOSIS — M546 Pain in thoracic spine: Secondary | ICD-10-CM | POA: Insufficient documentation

## 2022-03-14 DIAGNOSIS — M549 Dorsalgia, unspecified: Secondary | ICD-10-CM | POA: Diagnosis not present

## 2022-03-14 DIAGNOSIS — Z91040 Latex allergy status: Secondary | ICD-10-CM | POA: Insufficient documentation

## 2022-03-14 DIAGNOSIS — Z3490 Encounter for supervision of normal pregnancy, unspecified, unspecified trimester: Secondary | ICD-10-CM

## 2022-03-14 DIAGNOSIS — M5126 Other intervertebral disc displacement, lumbar region: Secondary | ICD-10-CM | POA: Diagnosis not present

## 2022-03-14 DIAGNOSIS — R202 Paresthesia of skin: Secondary | ICD-10-CM | POA: Insufficient documentation

## 2022-03-14 DIAGNOSIS — Z3A20 20 weeks gestation of pregnancy: Secondary | ICD-10-CM | POA: Diagnosis not present

## 2022-03-14 DIAGNOSIS — R200 Anesthesia of skin: Secondary | ICD-10-CM | POA: Diagnosis not present

## 2022-03-14 DIAGNOSIS — M545 Low back pain, unspecified: Secondary | ICD-10-CM | POA: Diagnosis not present

## 2022-03-14 DIAGNOSIS — O26892 Other specified pregnancy related conditions, second trimester: Secondary | ICD-10-CM | POA: Diagnosis present

## 2022-03-14 IMAGING — MR MR LUMBAR SPINE W/O CM
5 series · 30 of 48 positions shown · non-contrast
Comparison: Lumbar spine radiographs [DATE]

CLINICAL DATA: Low back pain common discharged approximately 1
month ago for herniated disc and inability to ambulate. Numbness in
left leg.

EXAM:
MRI LUMBAR SPINE WITHOUT CONTRAST
TECHNIQUE: Multiplanar, multisequence MR imaging of the lumbar spine was
performed. No intravenous contrast was administered.

[Series 5: T2 · sagittal · 4.0mm · 0.81mm/px · 6 of 17 slices shown (1 of 2)]
[im 1/17]
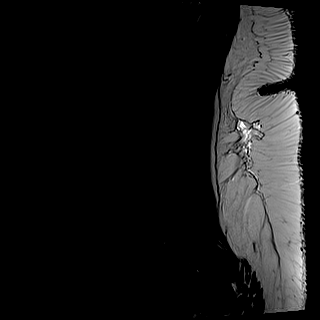
[im 4/17]
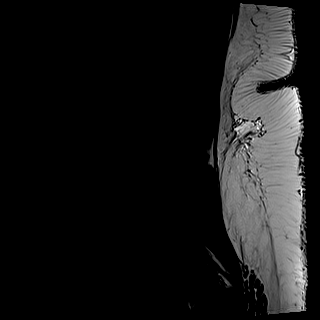
[im 7/17]
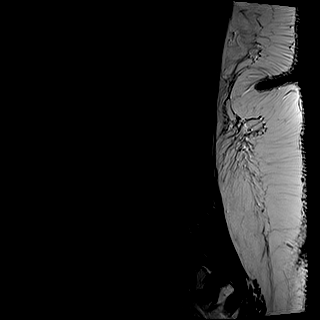
[im 10/17]
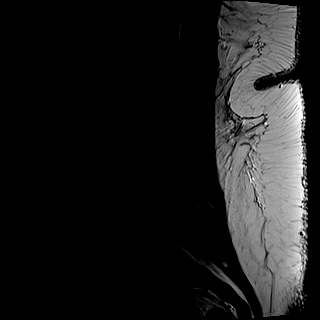
[im 13/17]
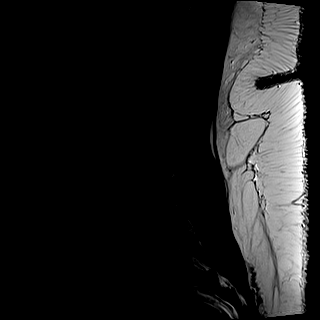
[im 17/17]
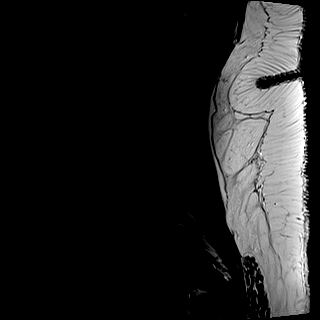

[Series 6: T1 · sagittal · 4.0mm · 0.81mm/px · 7 of 17 slices shown (1 of 2)]
[im 1/17]
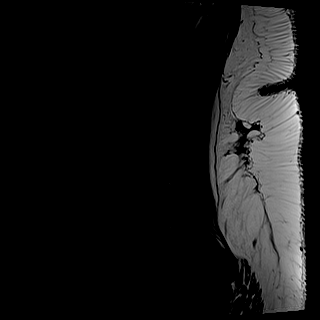
[im 3/17]
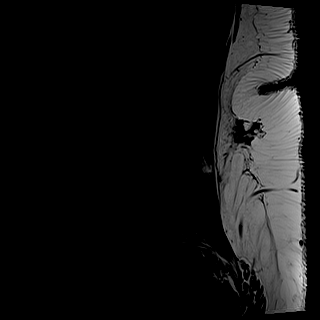
[im 6/17]
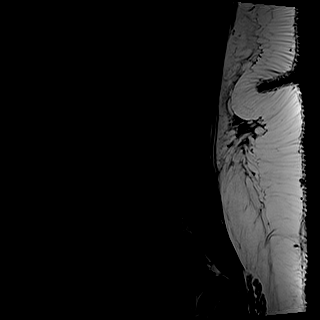
[im 9/17]
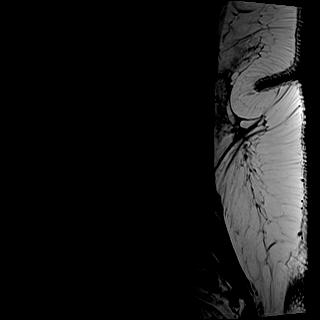
[im 11/17]
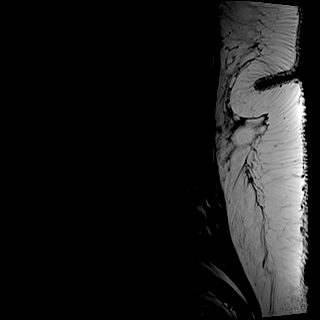
[im 14/17]
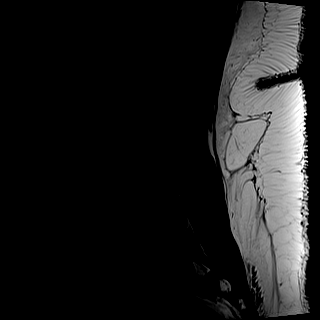
[im 17/17]
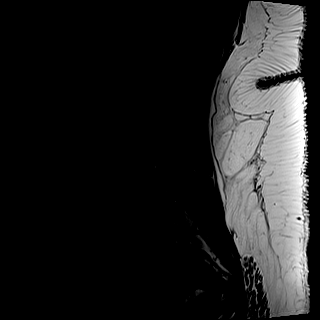

[Series 7: STIR · sagittal · 4.0mm · 0.41mm/px · 1 of 17 slices shown]
[im 1/17]
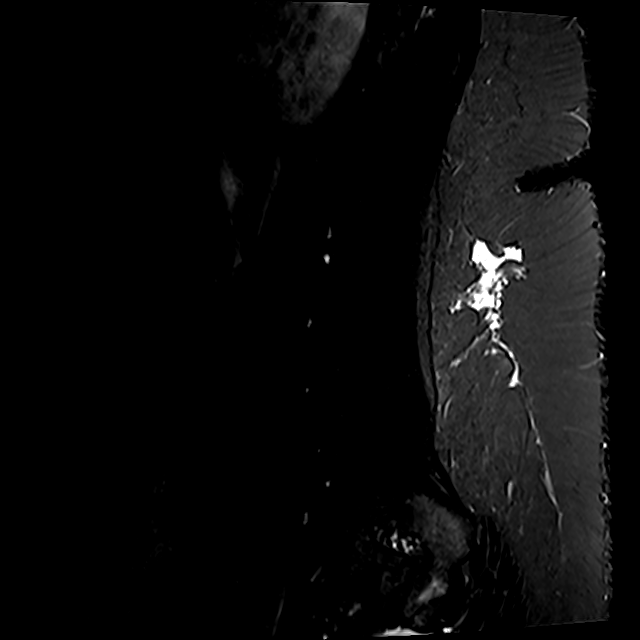

[Series 8: T2 · axial · 4.0mm · 0.78mm/px · z∈[-34,+185]mm · 8 of 36 slices shown (2 of 2)]
[im 1/36]
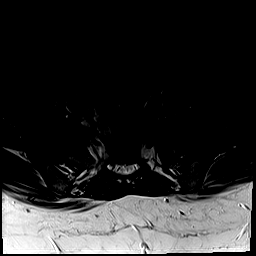
[im 6/36]
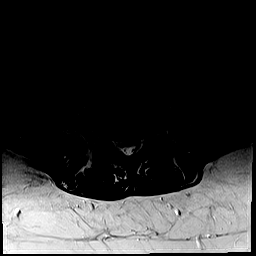
[im 11/36]
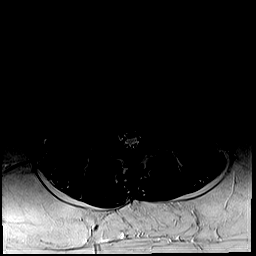
[im 17/36]
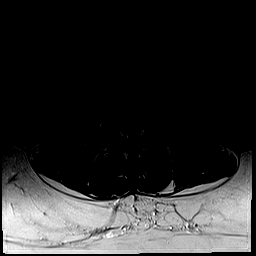
[im 19/36]
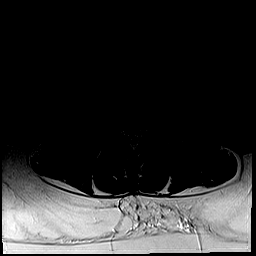
[im 25/36]
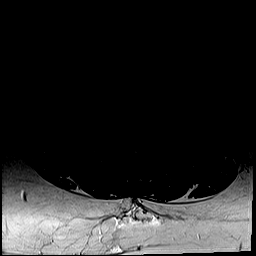
[im 30/36]
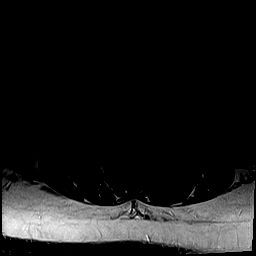
[im 36/36]
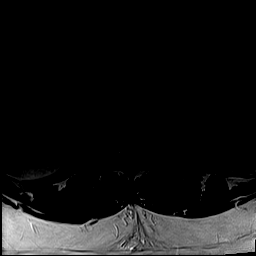

[Series 9: T1 · axial · 4.0mm · 0.39mm/px · z∈[-34,+185]mm · 8 of 36 slices shown (2 of 2)]
[im 1/36]
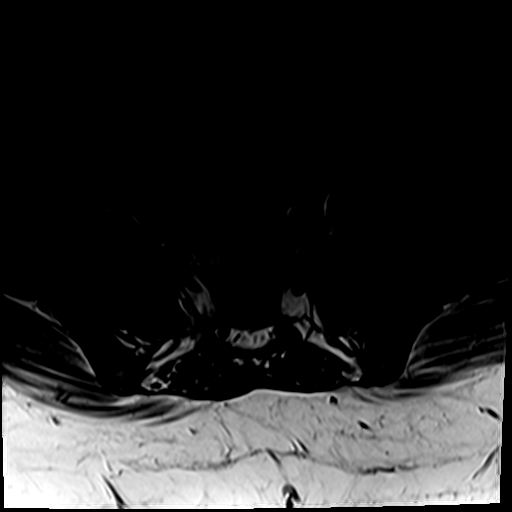
[im 6/36]
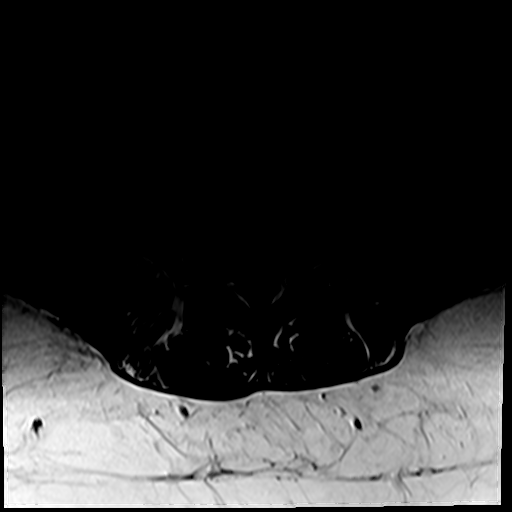
[im 11/36]
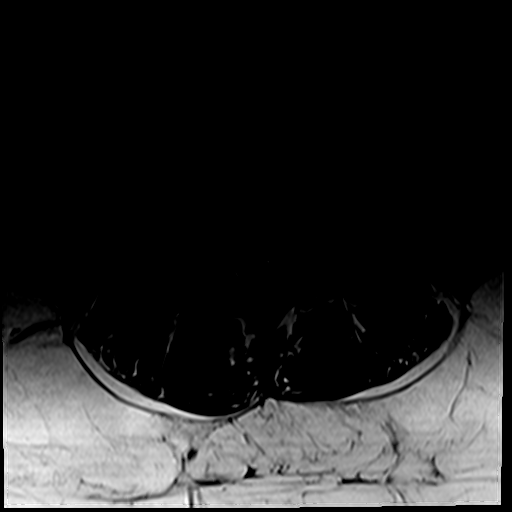
[im 17/36]
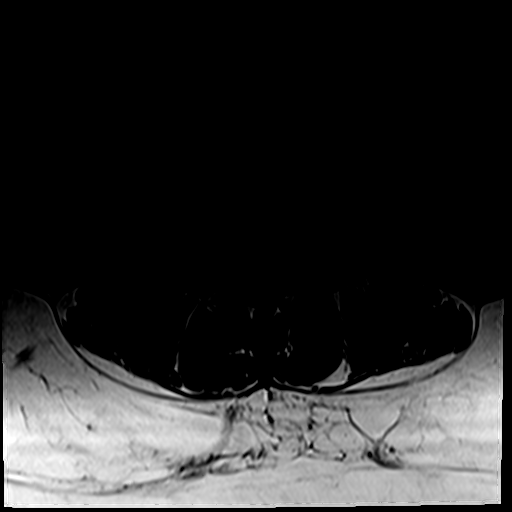
[im 19/36]
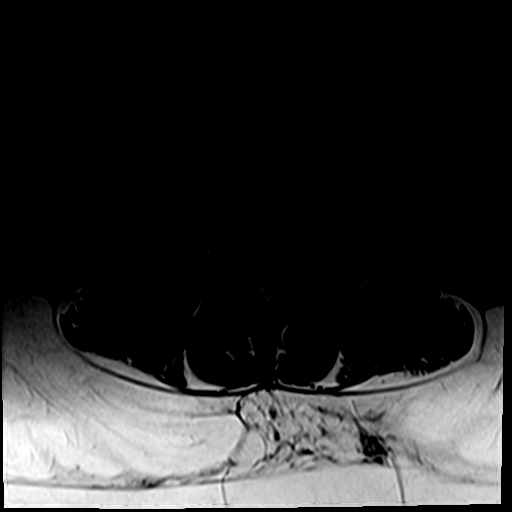
[im 25/36]
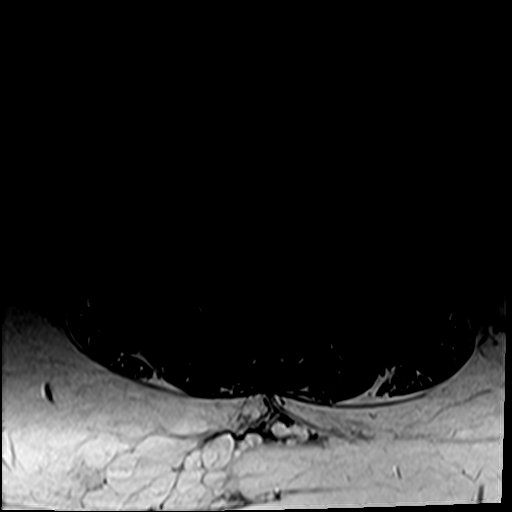
[im 30/36]
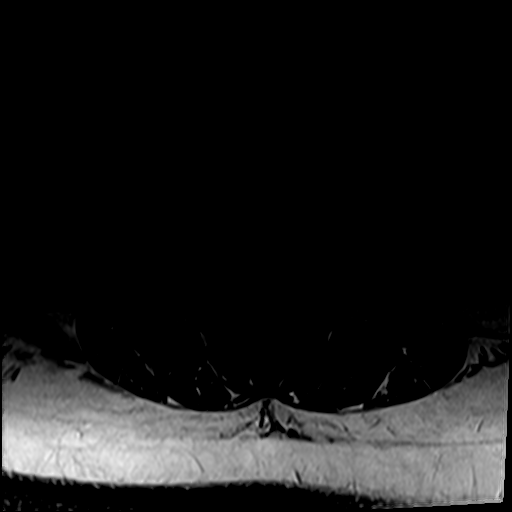
[im 36/36]
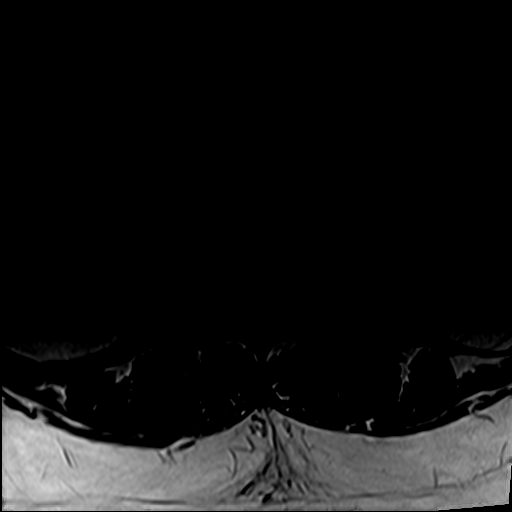

[30 of 48 positions shown; findings below may reference images not displayed]

FINDINGS: Segmentation: 5 non-rib-bearing lumbar vertebral bodies are assumed.
The lowest formed disc space is designated L5-S1.

Alignment:  Normal.

Vertebrae: Vertebral body heights are preserved. There is diffuse T1
hypointensity of the bone marrow. There is vertically oriented T1
and T2 hypointensity in the bilateral L5 pedicles without marrow
edema (6-3, 6-13). There is no marrow edema to suggest acute injury.

Conus medullaris and cauda equina: Conus extends to the T12 Level.
Conus and cauda equina appear normal.

Paraspinal and other soft tissues: A gravid uterus is noted. The
paraspinal soft tissues are unremarkable.

Disc levels:

There is disc desiccation and narrowing at L3-L4 through L5-S1.

T12-L1: No significant spinal canal or neural foraminal stenosis.

L1-L2: No significant spinal canal or neural foraminal stenosis

L2-L3: No significant spinal canal or neural foraminal stenosis

L3-L4: There is a mild broad-based disc protrusion without
significant spinal canal or neural foraminal stenosis

L4-L5: There is a large right central/subarticular zone inferiorly
migrated disc extrusion resulting in moderate narrowing of the
spinal canal with impingement of the traversing right L5 nerve root.
No significant neural foraminal stenosis

L5-S1: No significant spinal canal or neural foraminal stenosis.
IMPRESSION: 1. Large right central/subarticular disc extrusion at L4-L5 which
impinges the traversing L5 nerve root.
2. Mild disc protrusion at L3-L4 without significant spinal canal or
neural foraminal stenosis or nerve root impingement.
3. Chronic right and suspected left L5 pedicle fractures without
marrow edema to suggest acute injury.
4. Diffuse T1 hypointensity of the bone marrow is nonspecific and
can be seen in the setting of smoking, anemia, obesity, or less
likely infiltrative marrow process.

## 2022-03-14 IMAGING — MR MR CERVICAL SPINE W/O CM
5 series · 40 of 48 positions shown · non-contrast
Comparison: None Available.

CLINICAL DATA: Back/neck/head pain. Recently discharged for
herniated disc and inability to ambulate. Pain began yesterday,
worsening today unable to move head in any direction.

EXAM:
MRI CERVICAL SPINE WITHOUT CONTRAST
TECHNIQUE: Multiplanar, multisequence MR imaging of the cervical spine was
performed. No intravenous contrast was administered.

[Series 9: T2 · sagittal · 3.0mm · 0.62mm/px · 6 of 15 slices shown (1 of 2)]
[im 1/15]
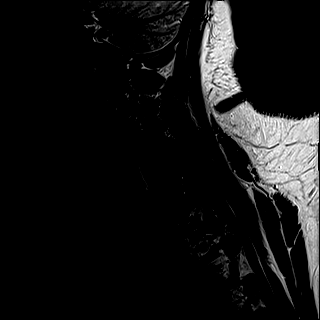
[im 3/15]
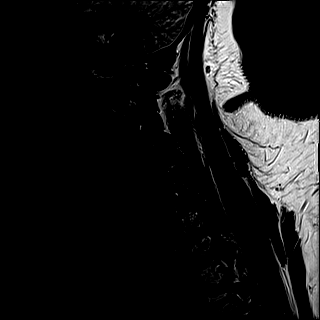
[im 6/15]
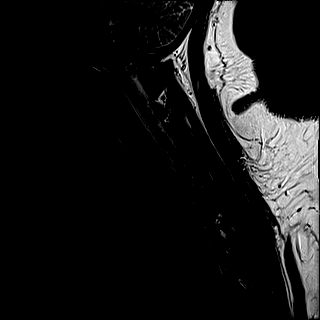
[im 9/15]
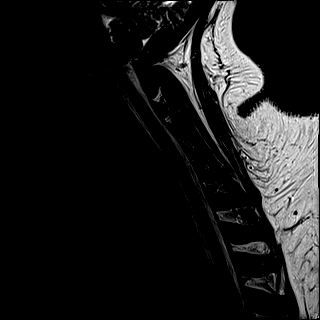
[im 12/15]
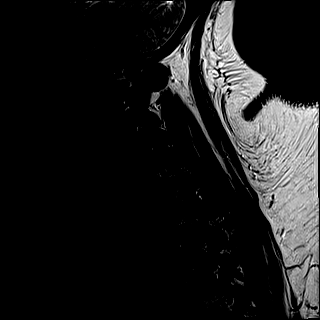
[im 15/15]
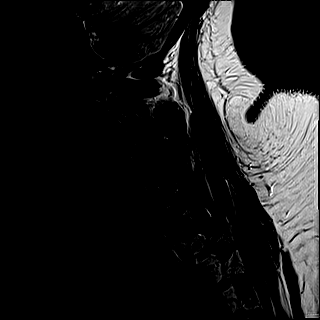

[Series 10: FLAIR · sagittal · 3.0mm · 0.78mm/px · 7 of 15 slices shown]
[im 1/15]
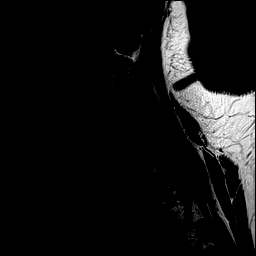
[im 3/15]
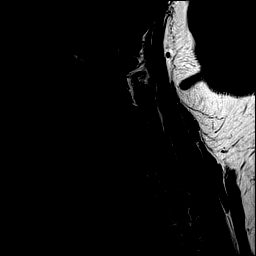
[im 5/15]
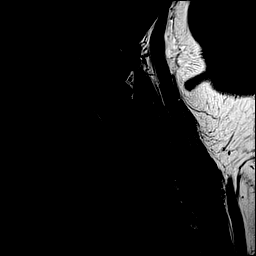
[im 8/15]
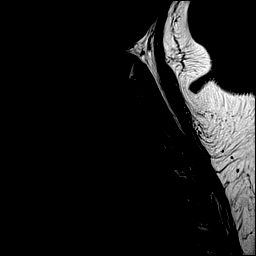
[im 10/15]
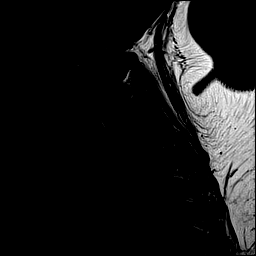
[im 12/15]
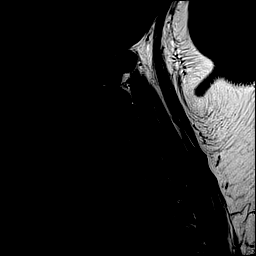
[im 15/15]
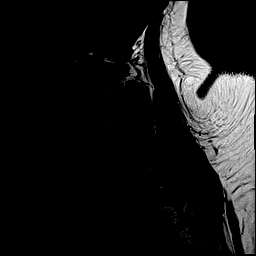

[Series 11: STIR · sagittal · 3.0mm · 0.62mm/px · 7 of 15 slices shown]
[im 1/15]
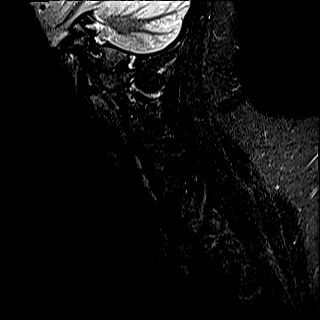
[im 3/15]
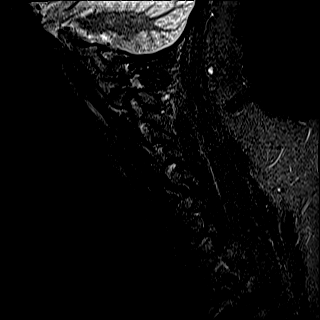
[im 5/15]
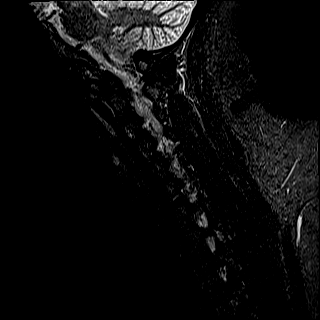
[im 8/15]
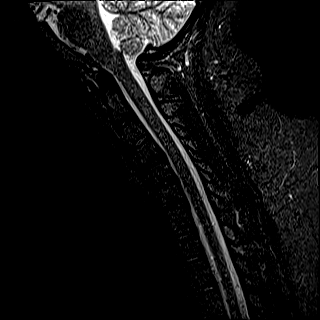
[im 10/15]
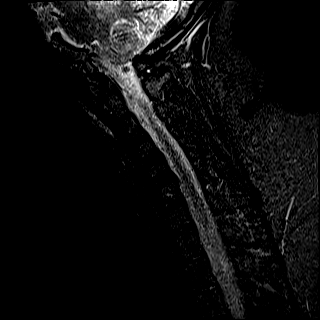
[im 12/15]
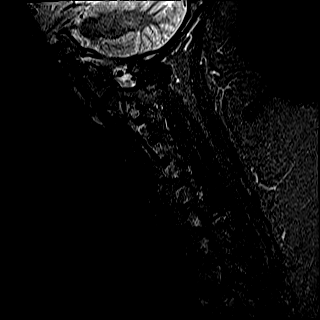
[im 15/15]
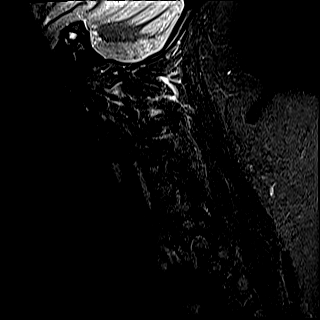

[Series 12: T2 · axial · 3.0mm · 0.70mm/px · z∈[-110,-24]mm · 12 of 29 slices shown (2 of 2)]
[im 1/29]
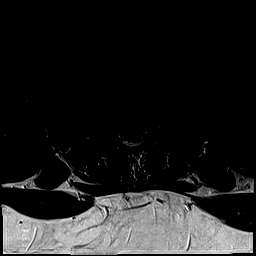
[im 3/29]
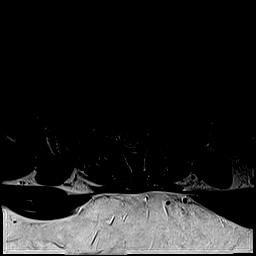
[im 5/29]
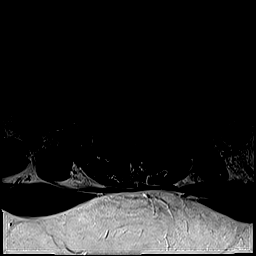
[im 7/29]
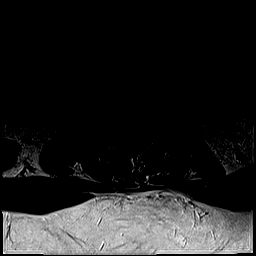
[im 9/29]
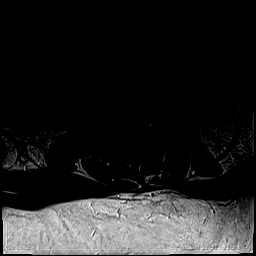
[im 11/29]
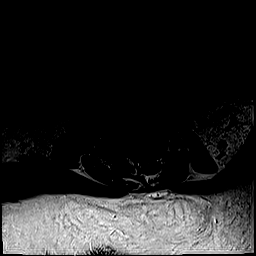
[im 13/29]
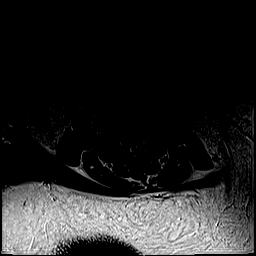
[im 16/29]
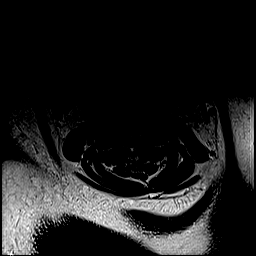
[im 18/29]
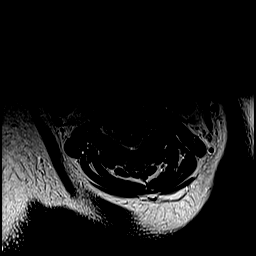
[im 20/29]
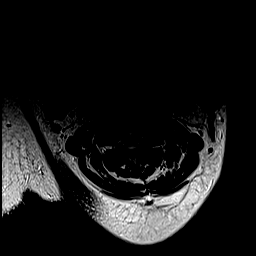
[im 24/29]
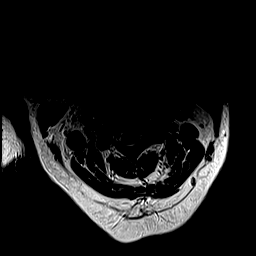
[im 29/29]
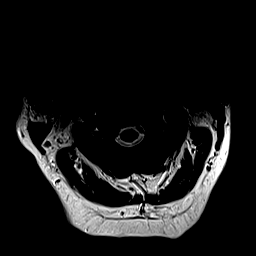

[Series 13: ax mpgr · axial · 3.0mm · 0.35mm/px · z∈[-110,-24]mm · 8 of 29 slices shown]
[im 1/29]
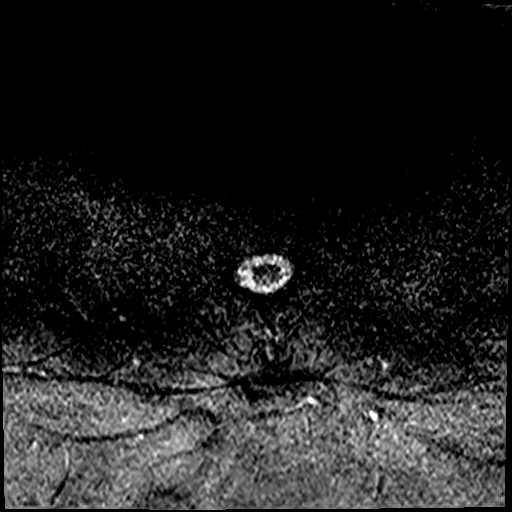
[im 5/29]
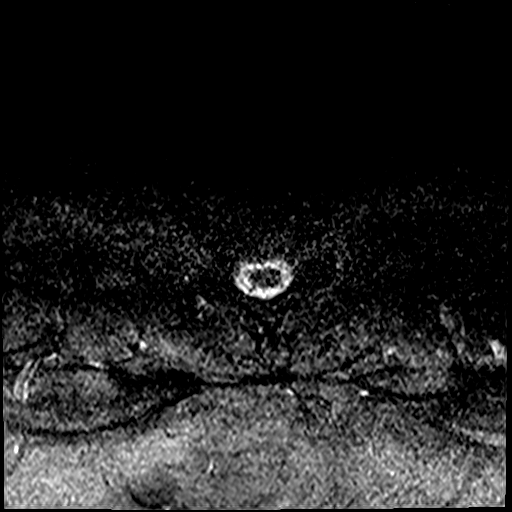
[im 9/29]
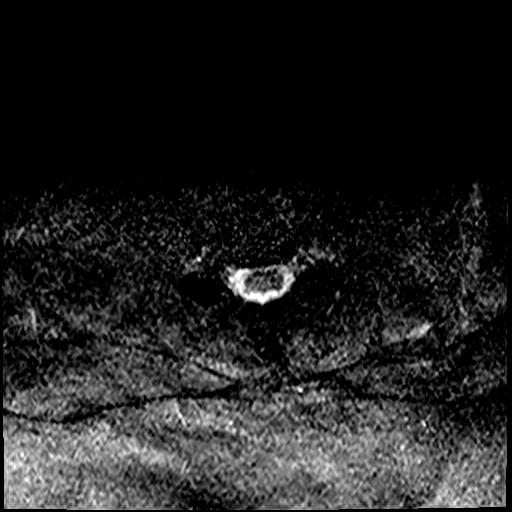
[im 13/29]
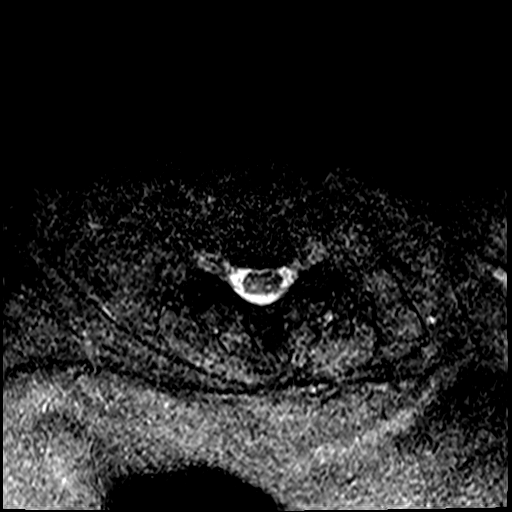
[im 16/29]
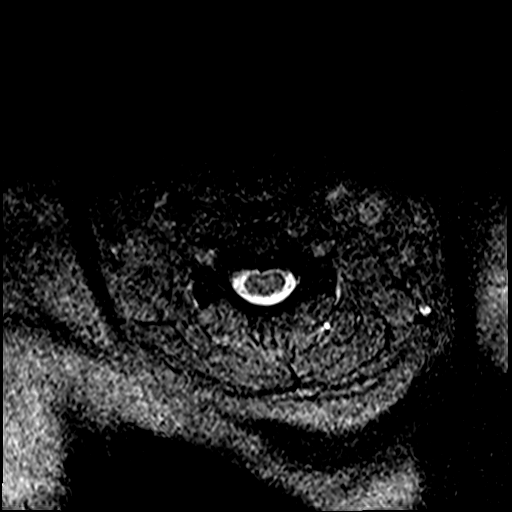
[im 20/29]
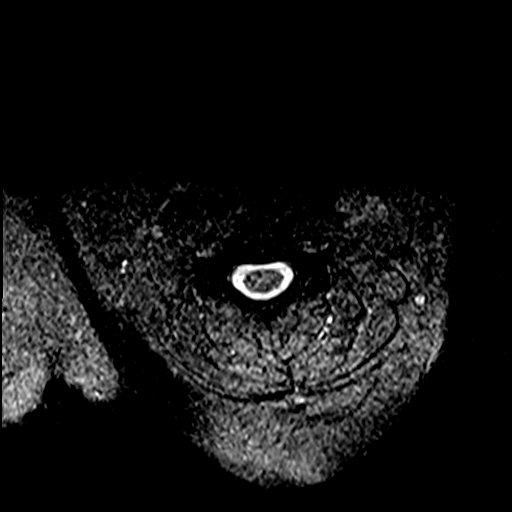
[im 24/29]
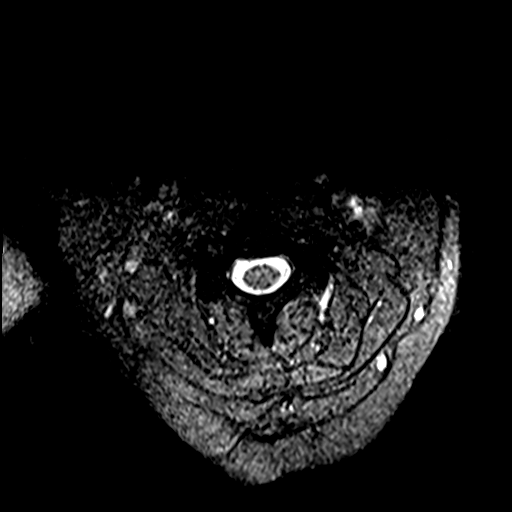
[im 29/29]
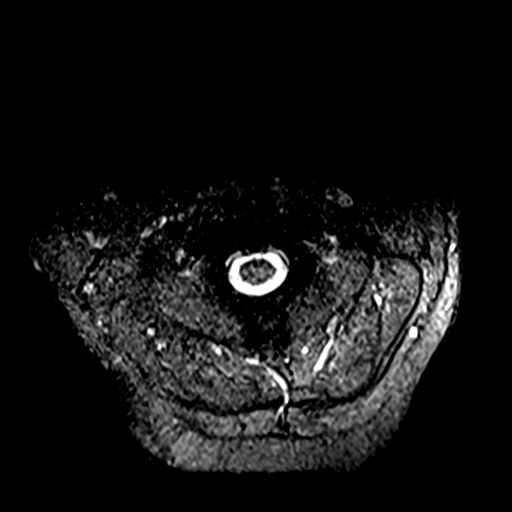

[40 of 48 positions shown; findings below may reference images not displayed]

FINDINGS: Alignment: There is slight reversal of the normal cervical spine
lordosis. There is no antero or retrolisthesis.

Vertebrae: Vertebral body heights are preserved. Background marrow
signal is within normal limits. There is no suspicious signal
abnormality or marrow edema.

Cord: Normal in signal and morphology.

Posterior Fossa, vertebral arteries, paraspinal tissues: The imaged
posterior fossa is unremarkable. Vertebral artery flow voids are
normal. The paraspinal soft tissues are unremarkable.

Disc levels:

The disc heights are preserved. There is no significant spinal canal
or neural foraminal stenosis. There is no evidence of disc
herniation.
IMPRESSION: Normal cervical spine MRI.

## 2022-03-14 MED ORDER — ACETAMINOPHEN 500 MG PO TABS
1000.0000 mg | ORAL_TABLET | Freq: Once | ORAL | Status: AC
  Administered 2022-03-14: 1000 mg via ORAL
  Filled 2022-03-14: qty 2

## 2022-03-14 MED ORDER — LORAZEPAM 2 MG/ML IJ SOLN
1.0000 mg | Freq: Once | INTRAMUSCULAR | Status: AC
  Administered 2022-03-14: 1 mg via INTRAVENOUS
  Filled 2022-03-14: qty 1

## 2022-03-14 MED ORDER — HYDROMORPHONE HCL 2 MG PO TABS: 2.0000 mg | ORAL_TABLET | Freq: Four times a day (QID) | ORAL | 0 refills | Status: DC | PRN

## 2022-03-14 MED ORDER — PREDNISONE 10 MG PO TABS: 10.0000 mg | ORAL_TABLET | Freq: Every day | ORAL | 0 refills | Status: DC

## 2022-03-14 MED ORDER — HYDROMORPHONE HCL 2 MG PO TABS
2.0000 mg | ORAL_TABLET | ORAL | Status: DC | PRN
  Administered 2022-03-14: 2 mg via ORAL
  Filled 2022-03-14: qty 1

## 2022-03-14 NOTE — ED Notes (Signed)
See triage note  presents with back pain  states she was seen about 1 month ago for same  states pain is getting worse  pain radiates from neck into lower back ?

## 2022-03-14 NOTE — ED Triage Notes (Signed)
To triage via [facility] with c/o back/neck/head pain. Reports she was discharged apx 1 month ago for bulging/herniated disc and inability to ambulate.  ?Pain began yesterday and worsened today. Pt states she is unable to move head in any direction and feels numbness in left leg. Pt currently [redacted] weeks pregnant.. G5 P2. ?

## 2022-03-14 NOTE — OB Triage Note (Addendum)
Pt is a G3P2 at [redacted]w[redacted]d presenting to L&D triage c/o back pain radiating up her neck rating it a 10/10. Pt endorses having a herniating disc recently and states this pain feels like that pain. No vaginal bleeding, LOF, cramping, or ctx. FHT 120. VSS. CNM notified of pt's arrival and states to transfer pt to ED after checking on baby/pregnancy.  ?

## 2022-03-14 NOTE — Discharge Instructions (Signed)

## 2022-03-14 NOTE — ED Provider Notes (Signed)
? ?[facility] ?Provider Note ? ? ? Event Date/Time  ? First MD Initiated Contact with Patient 03/14/22 0720   ?  (approximate) ? ? ?History  ? ?Back Pain ? ? ?HPI ? ?Tammy Mendez is a 26 y.o. female   currently pregnant with recent diagnosis of herniated disc with low back pain and paresthesias in bilateral lower legs with recent hospitalization for same presents to the ER now with neck pain and feeling like she is having worsening paresthesias to the left leg.  She is able to walk.  She actually went up to L&D first and was cleared from L&D.  She denies any fevers.  States that she is out of her pain medication but this pain is new.  Denies any abdominal pain.  Had trouble sleeping last night due to the pain. ? ?  ? ? ?Physical Exam  ? ?Triage Vital Signs: ?ED Triage Vitals  ?Enc Vitals Group  ?   BP 03/14/22 0137 (!) 138/94  ?   Pulse Rate 03/14/22 0137 (!) 121  ?   Resp 03/14/22 0137 18  ?   Temp 03/14/22 0137 98.6 ?F (37 ?C)  ?   Temp Source 03/14/22 0137 Oral  ?   SpO2 03/14/22 0137 97 %  ?   Weight 03/14/22 0138 (!) 330 lb (149.7 kg)  ?   Height 03/14/22 0138 5\' 10"  (1.778 m)  ?   Head Circumference --   ?   Peak Flow --   ?   Pain Score 03/14/22 0137 10  ?   Pain Loc --   ?   Pain Edu? --   ?   Excl. in [place]? --   ? ? ?Most recent vital signs: ?Vitals:  ? 03/14/22 0137 03/14/22 0710  ?BP: (!) 138/94 130/88  ?Pulse: (!) 121 100  ?Resp: 18 18  ?Temp: 98.6 ?F (37 ?C)   ?SpO2: 97% 98%  ? ? ? ?Constitutional: Alert  ?Eyes: Conjunctivae are normal.  ?Head: Atraumatic. ?Nose: No congestion/rhinnorhea. ?Mouth/Throat: Mucous membranes are moist.   ?Neck: Painless ROM.  ?Cardiovascular:   Good peripheral circulation. ?Respiratory: Normal respiratory effort.  No retractions.  ?Gastrointestinal: Soft and nontender.  ?Musculoskeletal:  no deformity ?Neurologic:  CN- intact.  No facial droop, Normal FNF.  Normal heel to shin.  Sensation intact bilaterally. Normal speech and language. No gross focal  neurologic deficits are appreciated. ?Skin:  Skin is warm, dry and intact. No rash noted. ?Psychiatric: Mood and affect are normal. Speech and behavior are normal. ? ? ? ?ED Results / Procedures / Treatments  ? ?Labs ?(all labs ordered are listed, but only abnormal results are displayed) ?Labs Reviewed - No data to display ? ? ?EKG ? ? ? ? ?RADIOLOGY ?Please see ED Course for my review and interpretation. ? ?I personally reviewed all radiographic images ordered to evaluate for the above acute complaints and reviewed radiology reports and findings.  These findings were personally discussed with the patient.  Please see medical record for radiology report. ? ? ? ?PROCEDURES: ? ?Critical Care performed: No ? ?Procedures ? ? ?MEDICATIONS ORDERED IN ED: ?Medications  ?HYDROmorphone (DILAUDID) tablet 2 mg (2 mg Oral Given 03/14/22 0741)  ?acetaminophen (TYLENOL) tablet 1,000 mg (1,000 mg Oral Given 03/14/22 0236)  ?LORazepam (ATIVAN) injection 1 mg (1 mg Intravenous Given 03/14/22 0814)  ? ? ? ?IMPRESSION / MDM / ASSESSMENT AND PLAN / ED COURSE  ?I reviewed the triage vital signs and the nursing notes. ?             ?               ? ?  Differential diagnosis includes, but is not limited to, radiculopathy, herniated disc, discitis, fracture, musculoskeletal strain, torticollis ? ?Patient presented to the ER with new neck pain and and new paresthesias of the left leg.  Review of her MRI from March did have herniated disc with nerve root compression in the lumbar spine.  Cervical spine MRI was not performed.  Was not complaining of neck pain at that point.  Is not consistent with meningitis or infectious process.  Have a lower suspicion for cauda equina will order MRI cervical spine given worsening symptoms will order repeat MRI of the lumbar spine.  MRI thoracic spine was normal. ? ? ?Clinical Course as of 03/14/22 0949  ?Tue Mar 14, 2022  ?0810 Patient states that she gets severely claustrophobic with MRI.  States that they had  to give her IV Ativan during previous MRI. [PR]  ?0948 MRI with stable findings.  Her exam is not consistent with cauda equina.  Cauda equina appears normal on MRI.  Patient with some improvement after prednisone during previous admission.  She is requesting refill of her Dilaudid.  Checking PMP has not had recent fills.  Will give prednisone taper refill medication.  Does appear stable and appropriate for outpatient follow-up. [PR]  ?  ?Clinical Course User Index ?[PR] Merlyn Lot, MD  ? ? ? ?FINAL CLINICAL IMPRESSION(S) / ED DIAGNOSES  ? ?Final diagnoses:  ?Herniated lumbar intervertebral disc  ? ? ? ?Rx / DC Orders  ? ?ED Discharge Orders   ? ?      Ordered  ?  HYDROmorphone (DILAUDID) 2 MG tablet  Every 6 hours PRN       ? 03/14/22 0948  ?  predniSONE (DELTASONE) 10 MG tablet  Daily       ? 03/14/22 0948  ? ?  ?  ? ?  ? ? ? ?Note:  This document was prepared using Dragon voice recognition software and may include unintentional dictation errors. ? ?  ?Merlyn Lot, MD ?03/14/22 [phone] ? ?

## 2022-03-16 NOTE — Discharge Summary (Signed)
Tammy Mendez is a 26 y.o. female. She is at [redacted]w[redacted]d gestation. No LMP recorded. Patient is pregnant. ?Estimated Date of Delivery: 08/01/22 ? ?Prenatal care site: [facility]- unassigned ? ?Current pregnancy complicated by:  ?Herniated disc ? ?Chief complaint: Upper back and neck pain, recent dx herniated disc.  ? ? ?Maternal Medical History:  ? ?Past Medical History:  ?Diagnosis Date  ? Anxiety and depression   ? Gestational diabetes   ? ? ?Past Surgical History:  ?Procedure Laterality Date  ? CHOLECYSTECTOMY    ? DILATION AND EVACUATION N/A 10/02/2021  ? Procedure: DILATATION AND EVACUATION;  Surgeon: Conard Novak, MD;  Location: [facility] ORS;  Service: Gynecology;  Laterality: N/A;  ? ? ?Allergies  ?Allergen Reactions  ? Latex Rash  ? ? ?Prior to Admission medications   ?Medication Sig Start Date End Date Taking? Authorizing Provider  ?acetaminophen (TYLENOL) 500 MG tablet Take 2 tablets (1,000 mg total) by mouth every 6 (six) hours as needed. 01/01/22 01/01/23  Faythe Ghee, PA-C  ?HYDROmorphone (DILAUDID) 2 MG tablet Take 1 tablet (2 mg total) by mouth every 6 (six) hours as needed. 03/14/22   Willy Eddy, MD  ?ibuprofen (ADVIL) 600 MG tablet Take 1 tablet (600 mg total) by mouth every 6 (six) hours as needed for mild pain or moderate pain. 10/03/21   Conard Novak, MD  ?methocarbamol (ROBAXIN) 750 MG tablet Take 750 mg by mouth every 6 (six) hours as needed. 01/17/22   [provider]  ?metroNIDAZOLE (METROGEL VAGINAL) 0.75 % vaginal gel Place 1 Applicatorful vaginally at bedtime. 01/01/22   Sherrie Mustache Roselyn Bering, PA-C  ?norethindrone (MICRONOR) 0.35 MG tablet Take 1 tablet (0.35 mg total) by mouth daily. 10/03/21   Conard Novak, MD  ?predniSONE (DELTASONE) 10 MG tablet Take 1 tablet (10 mg total) by mouth daily. Day 1-2: Take 50 mg  ( 5 pills) Day 3-4 : Take 40 mg (4pills) Day 5-6: Take 30 mg (3 pills) Day 7-8:  Take 20 mg (2 pills) Day 9:  Take 10mg  (1 pill) 03/14/22   05/14/22, MD  ?Prenatal  Vit-Fe Fumarate-FA (WESTAB PLUS) 27-1 MG TABS Take 1 tablet by mouth daily. 01/17/22   [provider]  ?sertraline (ZOLOFT) 100 MG tablet Take 150 mg by mouth daily. 03/02/22   [provider]  ? ? ? ? ?Social History: She  reports that she has never smoked. She has never used smokeless tobacco. She reports current alcohol use. She reports that she does not currently use drugs. ? ?Family History: family history is not on file.  ? ?Review of Systems: A full review of systems was performed and negative except as noted in the HPI.   ? ? ?O: ? BP 137/85 (BP Location: Left Arm)   Pulse (!) 118   Temp 98.8 ?F (37.1 ?C) (Oral)   Resp 16  ?No results found for this or any previous visit (from the past 48 hour(s)).  ? ?Fetal  monitoring: FHR via doppler 120bpm ? ? ? ?A/P: 26 y.o. [redacted]w[redacted]d here for antenatal surveillance for back and neck pain ? ? ?Fetal Wellbeing: FHR via doppler ?Pt sent to ER for eval of non-pregnancy complaints.  ? ? ?[redacted]w[redacted]d, CNM ?03/16/2022  ?12:54 PM ? ?

## 2022-04-06 ENCOUNTER — Observation Stay
Admission: EM | Admit: 2022-04-06 | Discharge: 2022-04-06 | Disposition: A | Discharge status: Home / Self Care | Payer: Medicaid Other | Attending: Obstetrics | Admitting: Obstetrics

## 2022-04-06 ENCOUNTER — Observation Stay: Payer: Medicaid Other

## 2022-04-06 ENCOUNTER — Encounter: Payer: Self-pay | Admitting: Obstetrics and Gynecology

## 2022-04-06 ENCOUNTER — Other Ambulatory Visit: Payer: Self-pay

## 2022-04-06 ENCOUNTER — Emergency Department
Admission: EM | Admit: 2022-04-06 | Discharge: 2022-04-06 | Disposition: A | Discharge status: Home / Self Care | Payer: Medicaid Other | Attending: Emergency Medicine | Admitting: Emergency Medicine

## 2022-04-06 DIAGNOSIS — Z3A23 23 weeks gestation of pregnancy: Secondary | ICD-10-CM | POA: Diagnosis not present

## 2022-04-06 DIAGNOSIS — R1030 Lower abdominal pain, unspecified: Secondary | ICD-10-CM | POA: Insufficient documentation

## 2022-04-06 DIAGNOSIS — R109 Unspecified abdominal pain: Secondary | ICD-10-CM | POA: Diagnosis not present

## 2022-04-06 DIAGNOSIS — Z91040 Latex allergy status: Secondary | ICD-10-CM | POA: Diagnosis not present

## 2022-04-06 DIAGNOSIS — M79606 Pain in leg, unspecified: Secondary | ICD-10-CM | POA: Diagnosis not present

## 2022-04-06 DIAGNOSIS — O99212 Obesity complicating pregnancy, second trimester: Secondary | ICD-10-CM | POA: Diagnosis not present

## 2022-04-06 DIAGNOSIS — O99891 Other specified diseases and conditions complicating pregnancy: Secondary | ICD-10-CM | POA: Diagnosis not present

## 2022-04-06 DIAGNOSIS — E669 Obesity, unspecified: Secondary | ICD-10-CM | POA: Diagnosis not present

## 2022-04-06 DIAGNOSIS — G8929 Other chronic pain: Secondary | ICD-10-CM

## 2022-04-06 DIAGNOSIS — O26892 Other specified pregnancy related conditions, second trimester: Secondary | ICD-10-CM | POA: Insufficient documentation

## 2022-04-06 DIAGNOSIS — M5441 Lumbago with sciatica, right side: Secondary | ICD-10-CM | POA: Diagnosis not present

## 2022-04-06 DIAGNOSIS — M5442 Lumbago with sciatica, left side: Secondary | ICD-10-CM | POA: Diagnosis not present

## 2022-04-06 DIAGNOSIS — O26899 Other specified pregnancy related conditions, unspecified trimester: Secondary | ICD-10-CM | POA: Diagnosis present

## 2022-04-06 LAB — URINALYSIS, ROUTINE W REFLEX MICROSCOPIC
Bilirubin Urine: NEGATIVE
Glucose, UA: NEGATIVE mg/dL
Hgb urine dipstick: NEGATIVE
Ketones, ur: NEGATIVE mg/dL
Nitrite: NEGATIVE
Protein, ur: NEGATIVE mg/dL
Specific Gravity, Urine: 1.023 (ref 1.005–1.030)
pH: 5 (ref 5.0–8.0)

## 2022-04-06 MED ORDER — ACETAMINOPHEN 500 MG PO TABS
1000.0000 mg | ORAL_TABLET | Freq: Once | ORAL | Status: AC
  Administered 2022-04-06: 1000 mg via ORAL
  Filled 2022-04-06: qty 2

## 2022-04-06 MED ORDER — LIDOCAINE 5 % EX PTCH
1.0000 | MEDICATED_PATCH | Freq: Once | CUTANEOUS | Status: DC
  Administered 2022-04-06: 1 via TRANSDERMAL
  Filled 2022-04-06: qty 1

## 2022-04-06 MED ORDER — ACETAMINOPHEN 500 MG PO TABS
1000.0000 mg | ORAL_TABLET | Freq: Four times a day (QID) | ORAL | Status: DC | PRN
  Administered 2022-04-06: 1000 mg via ORAL
  Filled 2022-04-06: qty 2

## 2022-04-06 MED ORDER — CALCIUM CARBONATE ANTACID 500 MG PO CHEW: 2.0000 | CHEWABLE_TABLET | ORAL | Status: DC | PRN

## 2022-04-06 MED ORDER — HYDROMORPHONE HCL 2 MG PO TABS: 2.0000 mg | ORAL_TABLET | Freq: Four times a day (QID) | ORAL | 0 refills | Status: DC | PRN

## 2022-04-06 MED ORDER — LIDOCAINE 5 % EX PTCH: 1.0000 | MEDICATED_PATCH | Freq: Two times a day (BID) | CUTANEOUS | 0 refills | Status: DC

## 2022-04-06 MED ORDER — HYDROMORPHONE HCL 1 MG/ML IJ SOLN
1.0000 mg | Freq: Once | INTRAMUSCULAR | Status: AC
  Administered 2022-04-06: 1 mg via INTRAMUSCULAR
  Filled 2022-04-06: qty 1

## 2022-04-06 NOTE — Discharge Instructions (Signed)
Use Tylenol for pain and fevers.  Up to 1000 mg per dose, up to 4 times per day.  Do not take more than 4000 mg of Tylenol/acetaminophen within 24 hours.. ° °Please use lidocaine patches at your site of pain.  Apply 1 patch at a time, leave on for 12 hours, then remove for 12 hours.  12 hours on, 12 hours off.  Do not apply more than 1 patch at a time. ° °

## 2022-04-06 NOTE — Progress Notes (Signed)
Pt returned from US

## 2022-04-06 NOTE — Progress Notes (Signed)
Pt had [place] 01/01/22- dating [redacted]w[redacted]d with due date of 08/04/22. RN changed EDD from pt reported date of 08/01/22 to 08/04/22

## 2022-04-06 NOTE — ED Provider Notes (Signed)
[facility] Provider Note    Event Date/Time   First MD Initiated Contact with Patient 04/06/22 1103     (approximate)   History   Back Pain   HPI  Tammy Mendez is a 26 y.o. female who presents to the ED for evaluation of Back Pain   I review obstetric note from this morning.  Patient [redacted]w[redacted]d, morbidly obese.  Had a benign fetal monitor and obstetric screening this morning and requested to come down to [place] in the ED for evaluation of acute on chronic back pain attributed to a herniated disc. She was also evaluated in our ED on 5/9 for the same.  She had MRI of the lumbar and cervical spine on 5/9 with large right-sided disc extrusion at L4/5, normal cervical spine.  Patient presents to the ED requesting refill of her pain medication in the setting of running out in the past 1 week.  She reports that she has been on oral Dilaudid intermittently for the past 3 months since she was admitted to the [facility] in March.   She reports that she has had chronic numbness and paresthesias to her bilateral thighs since then as well, unchanged since her MRIs last month.  Denies any falls or injuries, fevers, saddle anesthesias.  Reports that she really needs to follow-up with her spine surgeon and pain clinic in [facility], but has not done so.  She is requesting information for local resources for this.  Physical Exam   Triage Vital Signs: ED Triage Vitals  Enc Vitals Group     BP 04/06/22 1029 131/90     Pulse Rate 04/06/22 1029 100     Resp 04/06/22 1029 16     Temp 04/06/22 1029 98.5 F (36.9 C)     Temp Source 04/06/22 1029 Oral     SpO2 04/06/22 1029 98 %     Weight 04/06/22 1030 (!) 320 lb (145.2 kg)     Height 04/06/22 1030 5\' 10"  (1.778 m)     Head Circumference --      Peak Flow --      Pain Score 04/06/22 1029 10     Pain Loc --      Pain Edu? --      Excl. in GC? --     Most recent vital signs: Vitals:   04/06/22 1029  BP: 131/90  Pulse: 100  Resp: 16   Temp: 98.5 F (36.9 C)  SpO2: 98%    General: Awake, no distress.  Morbidly obese.  Pleasant and conversational.  Ambulatory independently.  Sitting upright in the stretcher, pleasant and conversational without any apparent discomfort. CV:  Good peripheral perfusion.  Resp:  Normal effort.  Abd:  No distention.  MSK:  No deformity noted.  Poorly localizing lumbar tenderness to palpation, right greater than left.  No spinal step-offs or signs of trauma to the back. Neuro:  No focal deficits appreciated. Other:     ED Results / Procedures / Treatments   Labs (all labs ordered are listed, but only abnormal results are displayed) Labs Reviewed - No data to display  EKG   RADIOLOGY Obstetric ultrasound performed earlier today and in labor and delivery interpreted by me with single IUP without acute features  Official radiology report(s): 06/06/22 OB Limited  Result Date: 04/06/2022 CLINICAL DATA:  Abdominal pain EXAM: LIMITED OBSTETRIC ULTRASOUND COMPARISON:  01/01/2022 FINDINGS: Number of Fetuses: 1 Heart Rate:  144 bpm Movement: Yes Presentation: Cephalic Placental Location: Posterior  Previa: No Amniotic Fluid (Subjective):  Within normal limits. AFI: 6.5 cm BPD: 5.8 cm 23 w  5 d MATERNAL FINDINGS: Cervix:  Appears closed. Uterus/Adnexae: No abnormality visualized. IMPRESSION: Single live intrauterine pregnancy with estimated gestational age of [redacted] weeks and 2 days and 23 weeks and 5 days by ultrasound BPD. This exam is performed on an emergent basis and does not comprehensively evaluate fetal size, dating, or anatomy; follow-up complete OB US should be considered if further fetal assessment is warranted. Electronically Signed   By: Acquanetta Belling M.D.   On: 04/06/2022 09:58    PROCEDURES and INTERVENTIONS:  Procedures  Medications  lidocaine (LIDODERM) 5 % 1 patch (1 patch Transdermal Patch Applied 04/06/22 1157)  acetaminophen (TYLENOL) tablet 1,000 mg (1,000 mg Oral Given 04/06/22 1156)   HYDROmorphone (DILAUDID) injection 1 mg (1 mg Intramuscular Given 04/06/22 1154)     IMPRESSION / MDM / ASSESSMENT AND PLAN / ED COURSE  I reviewed the triage vital signs and the nursing notes.  Differential diagnosis includes, but is not limited to, chronic pain, cauda equina syndrome, compression fracture,  {Patient presents with symptoms of an acute illness or injury that is potentially life-threatening.  26 year old female presents to the ED with chronic pain requesting refill of analgesia and suitable for outpatient management.  She looks well, is ambulatory and has no red flag features.  Reassuring neurologic examination without signs of acute deficits.  Notifications for repeat MRIs.  We will provide analgesia here and a refill for couple days as well as local resources for neurosurgery to discuss her lumbar disc disease.  We discussed return precautions and she is suitable for outpatient management.      FINAL CLINICAL IMPRESSION(S) / ED DIAGNOSES   Final diagnoses:  Chronic bilateral low back pain with bilateral sciatica  [redacted] weeks gestation of pregnancy     Rx / DC Orders   ED Discharge Orders          Ordered    HYDROmorphone (DILAUDID) 2 MG tablet  Every 6 hours PRN        04/06/22 1154    lidocaine (LIDODERM) 5 %  Every 12 hours        04/06/22 1154             Note:  This document was prepared using Dragon voice recognition software and may include unintentional dictation errors.   Delton Prairie, MD 04/06/22 [phone]

## 2022-04-06 NOTE — ED Triage Notes (Signed)
Pt was seen upstairs in L&D for her back pain and nausea with vomiting, pt is 23.[redacted] weeks pregnant. Pt was told 2 weeks ago that she has a herniated disc that needs surg. Pt states that her back and legs are hurting so bad she's been vomiting and unable to sleep.

## 2022-04-06 NOTE — Progress Notes (Signed)
Pt off unit for US

## 2022-04-06 NOTE — OB Triage Note (Signed)
Pt G5P2 [redacted]w[redacted]d presents for abdominal pain and bilateral leg pain. Pt reports she was recently diagnosed with "bulging herniated disk". Pt was seen in the ER several weeks ago for this. Pt reports she is supposed to get care at [facility] due to placenta previa. +FM. Denies LOF/bleeding. Reports N/V. Lower sharp abd pain intermittent 7/10. Shooting bilateral leg pain 10/10. FHT 150. Toco applied. CNM at bedside.

## 2022-04-06 NOTE — Discharge Summary (Signed)
Tammy Mendez is a 26 y.o. female. She is at [redacted]w[redacted]d gestation. No LMP recorded. Patient is pregnant. Estimated Date of Delivery: 08/01/22  Prenatal care site:  [facility]  Chief complaint: intermittent lower abdominal pain and leg pain  HPI: Tammy Mendez presents to L&D with complaints of lower abdominal pain and leg pain at 23.[redacted] weeks gestation. Patient has a bulging herniated disk in her neck which is the contributing factor in her pain  Factors complicating pregnancy: Herniated disk Obesity Low lying placenta GHTN Hx of GDM Anemia Hx of depression/anxiety ADD   S: Resting comfortably. no CTX, no VB.no LOF,  Active fetal movement. Endorsed by patient  Maternal Medical History:  Past Medical Hx:  has a past medical history of Anxiety and depression and Gestational diabetes.    Past Surgical Hx:  has a past surgical history that includes Cholecystectomy and Dilation and evacuation (N/A, 10/02/2021).   Allergies  Allergen Reactions   Latex Rash     Prior to Admission medications   Medication Sig Start Date End Date Taking? Authorizing Provider  acetaminophen (TYLENOL) 500 MG tablet Take 2 tablets (1,000 mg total) by mouth every 6 (six) hours as needed. 01/01/22 01/01/23  Sherrie Mustache Roselyn Bering, PA-C  HYDROmorphone (DILAUDID) 2 MG tablet Take 1 tablet (2 mg total) by mouth every 6 (six) hours as needed. 03/14/22   Willy Eddy, MD  ibuprofen (ADVIL) 600 MG tablet Take 1 tablet (600 mg total) by mouth every 6 (six) hours as needed for mild pain or moderate pain. 10/03/21   Conard Novak, MD  methocarbamol (ROBAXIN) 750 MG tablet Take 750 mg by mouth every 6 (six) hours as needed. 01/17/22   [provider]  metroNIDAZOLE (METROGEL VAGINAL) 0.75 % vaginal gel Place 1 Applicatorful vaginally at bedtime. 01/01/22   Fisher, Roselyn Bering, PA-C  norethindrone (MICRONOR) 0.35 MG tablet Take 1 tablet (0.35 mg total) by mouth daily. 10/03/21   Conard Novak, MD  predniSONE (DELTASONE)  10 MG tablet Take 1 tablet (10 mg total) by mouth daily. Day 1-2: Take 50 mg  ( 5 pills) Day 3-4 : Take 40 mg (4pills) Day 5-6: Take 30 mg (3 pills) Day 7-8:  Take 20 mg (2 pills) Day 9:  Take 10mg  (1 pill) 03/14/22   05/14/22, MD  Prenatal Vit-Fe Fumarate-FA (WESTAB PLUS) 27-1 MG TABS Take 1 tablet by mouth daily. 01/17/22   [provider]  sertraline (ZOLOFT) 100 MG tablet Take 150 mg by mouth daily. 03/02/22   [provider]    Social History: She  reports that she has never smoked. She has never used smokeless tobacco. She reports that she does not currently use alcohol. She reports that she does not currently use drugs.  Family History: family history is not on file. ,no history of gyn cancers  Review of Systems: A full review of systems was performed and negative except as noted in the HPI.    O:  BP 137/87 (BP Location: Right Wrist)   Pulse (!) 109   Temp 98.8 F (37.1 C) (Oral)   Resp 17   Ht 5\' 10"  (1.778 m)   Wt (!) 145.2 kg   BMI 45.92 kg/m  No results found for this or any previous visit (from the past 48 hour(s)).   Constitutional: NAD, AAOx3  HE/ENT: extraocular movements grossly intact, moist mucous membranes CV: RRR PULM: nl respiratory effort Abd: gravid, non-tender, non-distended, soft  Ext: Non-tender, Nonedmeatous Psych: mood appropriate, speech normal  Pelvic : deferred SVE:     Fetal Monitor: FHT doppler 150 BPM   Number of Fetuses: 1   Heart Rate:  144 bpm   Movement: Yes   Presentation: Cephalic   Placental Location: Posterior   Previa: No   Amniotic Fluid (Subjective):  Within normal limits.   AFI: 6.5 cm   BPD: 5.8 cm 23 w  5 d   MATERNAL FINDINGS:   Cervix:  Appears closed.   Uterus/Adnexae: No abnormality visualized.   IMPRESSION: Single live intrauterine pregnancy with estimated gestational age of [redacted] weeks and 2 days and 23 weeks and 5 days by ultrasound BPD.    Assessment: 26 y.o. [redacted]w[redacted]d here for  antenatal surveillance during pregnancy.Patient reports ongoing pain from herniated disk and states she is unable to get to [facility] and was told that if she came to the ED at [facility] she would be transported to [facility].  Principle diagnosis: Abdominal pain affecting pregnancy, antepartum [O26.899, R10.9]   Plan: Labor: not present.  UA in progress D/c [facility] ED per patient request for management of bulging herniated disk, precautions reviewed, follow-up as scheduled.   ----- Chari Manning CNM Certified Nurse Midwife [facility]

## 2022-04-06 NOTE — Progress Notes (Signed)
Pt discharged from labor and delivery and taken by wheelchair to ED to be seen for leg pain.

## 2022-04-10 DIAGNOSIS — O99891 Other specified diseases and conditions complicating pregnancy: Secondary | ICD-10-CM | POA: Diagnosis not present

## 2022-04-10 DIAGNOSIS — S39012A Strain of muscle, fascia and tendon of lower back, initial encounter: Secondary | ICD-10-CM | POA: Diagnosis not present

## 2022-04-10 DIAGNOSIS — M79604 Pain in right leg: Secondary | ICD-10-CM | POA: Diagnosis not present

## 2022-04-10 DIAGNOSIS — F1291 Cannabis use, unspecified, in remission: Secondary | ICD-10-CM | POA: Diagnosis not present

## 2022-04-10 DIAGNOSIS — O9A212 Injury, poisoning and certain other consequences of external causes complicating pregnancy, second trimester: Secondary | ICD-10-CM | POA: Diagnosis not present

## 2022-04-10 DIAGNOSIS — M5441 Lumbago with sciatica, right side: Secondary | ICD-10-CM | POA: Diagnosis not present

## 2022-04-10 DIAGNOSIS — W19XXXA Unspecified fall, initial encounter: Secondary | ICD-10-CM | POA: Diagnosis not present

## 2022-04-10 DIAGNOSIS — M6281 Muscle weakness (generalized): Secondary | ICD-10-CM | POA: Diagnosis not present

## 2022-04-10 DIAGNOSIS — O09892 Supervision of other high risk pregnancies, second trimester: Secondary | ICD-10-CM | POA: Diagnosis not present

## 2022-04-10 DIAGNOSIS — M549 Dorsalgia, unspecified: Secondary | ICD-10-CM | POA: Diagnosis not present

## 2022-04-10 DIAGNOSIS — Z7982 Long term (current) use of aspirin: Secondary | ICD-10-CM | POA: Diagnosis not present

## 2022-04-10 DIAGNOSIS — Z87891 Personal history of nicotine dependence: Secondary | ICD-10-CM | POA: Diagnosis not present

## 2022-04-10 DIAGNOSIS — R200 Anesthesia of skin: Secondary | ICD-10-CM | POA: Diagnosis not present

## 2022-04-10 DIAGNOSIS — Z6841 Body Mass Index (BMI) 40.0 and over, adult: Secondary | ICD-10-CM | POA: Diagnosis not present

## 2022-04-10 DIAGNOSIS — M5410 Radiculopathy, site unspecified: Secondary | ICD-10-CM | POA: Diagnosis not present

## 2022-04-10 DIAGNOSIS — Z5321 Procedure and treatment not carried out due to patient leaving prior to being seen by health care provider: Secondary | ICD-10-CM | POA: Diagnosis not present

## 2022-04-10 DIAGNOSIS — Z7722 Contact with and (suspected) exposure to environmental tobacco smoke (acute) (chronic): Secondary | ICD-10-CM | POA: Diagnosis not present

## 2022-04-10 DIAGNOSIS — O4442 Low lying placenta NOS or without hemorrhage, second trimester: Secondary | ICD-10-CM | POA: Diagnosis not present

## 2022-04-10 DIAGNOSIS — Z79899 Other long term (current) drug therapy: Secondary | ICD-10-CM | POA: Diagnosis not present

## 2022-04-10 DIAGNOSIS — M79605 Pain in left leg: Secondary | ICD-10-CM | POA: Diagnosis not present

## 2022-04-10 DIAGNOSIS — Z3A24 24 weeks gestation of pregnancy: Secondary | ICD-10-CM | POA: Diagnosis not present

## 2022-04-10 DIAGNOSIS — O99212 Obesity complicating pregnancy, second trimester: Secondary | ICD-10-CM | POA: Diagnosis not present

## 2022-04-11 DIAGNOSIS — R200 Anesthesia of skin: Secondary | ICD-10-CM | POA: Diagnosis not present

## 2022-04-11 DIAGNOSIS — O99891 Other specified diseases and conditions complicating pregnancy: Secondary | ICD-10-CM | POA: Diagnosis not present

## 2022-04-11 DIAGNOSIS — R202 Paresthesia of skin: Secondary | ICD-10-CM | POA: Diagnosis not present

## 2022-04-11 DIAGNOSIS — M545 Low back pain, unspecified: Secondary | ICD-10-CM | POA: Diagnosis not present

## 2022-04-11 DIAGNOSIS — Z3A23 23 weeks gestation of pregnancy: Secondary | ICD-10-CM | POA: Diagnosis not present

## 2022-04-11 DIAGNOSIS — M5127 Other intervertebral disc displacement, lumbosacral region: Secondary | ICD-10-CM | POA: Diagnosis not present

## 2022-07-14 DIAGNOSIS — Z3A37 37 weeks gestation of pregnancy: Secondary | ICD-10-CM | POA: Diagnosis not present

## 2022-07-14 DIAGNOSIS — O10013 Pre-existing essential hypertension complicating pregnancy, third trimester: Secondary | ICD-10-CM | POA: Diagnosis not present

## 2022-07-14 DIAGNOSIS — M5127 Other intervertebral disc displacement, lumbosacral region: Secondary | ICD-10-CM | POA: Diagnosis not present

## 2022-07-31 DIAGNOSIS — R0602 Shortness of breath: Secondary | ICD-10-CM | POA: Diagnosis not present

## 2022-07-31 DIAGNOSIS — Z91040 Latex allergy status: Secondary | ICD-10-CM | POA: Diagnosis not present

## 2022-07-31 DIAGNOSIS — O165 Unspecified maternal hypertension, complicating the puerperium: Secondary | ICD-10-CM | POA: Diagnosis not present

## 2022-07-31 DIAGNOSIS — O99893 Other specified diseases and conditions complicating puerperium: Secondary | ICD-10-CM | POA: Diagnosis not present

## 2022-07-31 DIAGNOSIS — R69 Illness, unspecified: Secondary | ICD-10-CM | POA: Diagnosis not present

## 2022-07-31 DIAGNOSIS — O115 Pre-existing hypertension with pre-eclampsia, complicating the puerperium: Secondary | ICD-10-CM | POA: Diagnosis not present

## 2022-07-31 DIAGNOSIS — O152 Eclampsia in the puerperium: Secondary | ICD-10-CM | POA: Diagnosis not present

## 2022-07-31 DIAGNOSIS — R42 Dizziness and giddiness: Secondary | ICD-10-CM | POA: Diagnosis not present

## 2022-07-31 DIAGNOSIS — R079 Chest pain, unspecified: Secondary | ICD-10-CM | POA: Diagnosis not present

## 2022-07-31 DIAGNOSIS — I071 Rheumatic tricuspid insufficiency: Secondary | ICD-10-CM | POA: Diagnosis not present

## 2022-07-31 DIAGNOSIS — Z79899 Other long term (current) drug therapy: Secondary | ICD-10-CM | POA: Diagnosis not present

## 2022-07-31 DIAGNOSIS — Z7722 Contact with and (suspected) exposure to environmental tobacco smoke (acute) (chronic): Secondary | ICD-10-CM | POA: Diagnosis not present

## 2022-07-31 DIAGNOSIS — Z136 Encounter for screening for cardiovascular disorders: Secondary | ICD-10-CM | POA: Diagnosis not present

## 2022-07-31 DIAGNOSIS — R519 Headache, unspecified: Secondary | ICD-10-CM | POA: Diagnosis not present

## 2022-07-31 DIAGNOSIS — R030 Elevated blood-pressure reading, without diagnosis of hypertension: Secondary | ICD-10-CM | POA: Diagnosis not present

## 2022-08-01 DIAGNOSIS — G43909 Migraine, unspecified, not intractable, without status migrainosus: Secondary | ICD-10-CM | POA: Diagnosis not present

## 2022-08-01 DIAGNOSIS — O99355 Diseases of the nervous system complicating the puerperium: Secondary | ICD-10-CM | POA: Diagnosis not present

## 2022-08-01 DIAGNOSIS — R519 Headache, unspecified: Secondary | ICD-10-CM | POA: Diagnosis not present

## 2022-08-01 DIAGNOSIS — O99893 Other specified diseases and conditions complicating puerperium: Secondary | ICD-10-CM | POA: Diagnosis not present

## 2022-08-01 DIAGNOSIS — R42 Dizziness and giddiness: Secondary | ICD-10-CM | POA: Diagnosis not present

## 2022-08-01 DIAGNOSIS — H538 Other visual disturbances: Secondary | ICD-10-CM | POA: Diagnosis not present

## 2022-08-01 DIAGNOSIS — G43111 Migraine with aura, intractable, with status migrainosus: Secondary | ICD-10-CM | POA: Diagnosis not present

## 2022-08-02 DIAGNOSIS — O99355 Diseases of the nervous system complicating the puerperium: Secondary | ICD-10-CM | POA: Diagnosis not present

## 2022-08-02 DIAGNOSIS — G43109 Migraine with aura, not intractable, without status migrainosus: Secondary | ICD-10-CM | POA: Diagnosis not present

## 2022-08-02 DIAGNOSIS — O99893 Other specified diseases and conditions complicating puerperium: Secondary | ICD-10-CM | POA: Diagnosis not present

## 2022-08-02 DIAGNOSIS — R0789 Other chest pain: Secondary | ICD-10-CM | POA: Diagnosis not present

## 2022-08-02 DIAGNOSIS — M7989 Other specified soft tissue disorders: Secondary | ICD-10-CM | POA: Diagnosis not present

## 2022-08-02 DIAGNOSIS — R69 Illness, unspecified: Secondary | ICD-10-CM | POA: Diagnosis not present

## 2022-08-02 DIAGNOSIS — F329 Major depressive disorder, single episode, unspecified: Secondary | ICD-10-CM | POA: Diagnosis not present

## 2022-08-02 DIAGNOSIS — H538 Other visual disturbances: Secondary | ICD-10-CM | POA: Diagnosis not present

## 2022-08-02 DIAGNOSIS — R079 Chest pain, unspecified: Secondary | ICD-10-CM | POA: Diagnosis not present

## 2022-08-02 DIAGNOSIS — O99345 Other mental disorders complicating the puerperium: Secondary | ICD-10-CM | POA: Diagnosis not present

## 2022-08-02 DIAGNOSIS — R519 Headache, unspecified: Secondary | ICD-10-CM | POA: Diagnosis not present

## 2022-08-02 DIAGNOSIS — R000 Tachycardia, unspecified: Secondary | ICD-10-CM | POA: Diagnosis not present

## 2022-08-03 DIAGNOSIS — I10 Essential (primary) hypertension: Secondary | ICD-10-CM | POA: Diagnosis not present

## 2022-09-25 ENCOUNTER — Other Ambulatory Visit: Payer: Self-pay

## 2022-09-25 ENCOUNTER — Emergency Department
Admission: EM | Admit: 2022-09-25 | Discharge: 2022-09-25 | Disposition: A | Discharge status: Home / Self Care | Payer: 59 | Attending: Emergency Medicine | Admitting: Emergency Medicine

## 2022-09-25 DIAGNOSIS — G8929 Other chronic pain: Secondary | ICD-10-CM | POA: Insufficient documentation

## 2022-09-25 DIAGNOSIS — M545 Low back pain, unspecified: Secondary | ICD-10-CM

## 2022-09-25 DIAGNOSIS — M5459 Other low back pain: Secondary | ICD-10-CM | POA: Diagnosis not present

## 2022-09-25 LAB — CBC WITH DIFFERENTIAL/PLATELET
Abs Immature Granulocytes: 0.03 10*3/uL (ref 0.00–0.07)
Basophils Absolute: 0 10*3/uL (ref 0.0–0.1)
Basophils Relative: 0 %
Eosinophils Absolute: 0.3 10*3/uL (ref 0.0–0.5)
Eosinophils Relative: 3 %
HCT: 33.3 % — ABNORMAL LOW (ref 36.0–46.0)
Hemoglobin: 9.8 g/dL — ABNORMAL LOW (ref 12.0–15.0)
Immature Granulocytes: 0 %
Lymphocytes Relative: 24 %
Lymphs Abs: 2 10*3/uL (ref 0.7–4.0)
MCH: 21.4 pg — ABNORMAL LOW (ref 26.0–34.0)
MCHC: 29.4 g/dL — ABNORMAL LOW (ref 30.0–36.0)
MCV: 72.9 fL — ABNORMAL LOW (ref 80.0–100.0)
Monocytes Absolute: 0.7 10*3/uL (ref 0.1–1.0)
Monocytes Relative: 8 %
Neutro Abs: 5.4 10*3/uL (ref 1.7–7.7)
Neutrophils Relative %: 65 %
Platelets: 339 10*3/uL (ref 150–400)
RBC: 4.57 MIL/uL (ref 3.87–5.11)
RDW: 16.5 % — ABNORMAL HIGH (ref 11.5–15.5)
WBC: 8.4 10*3/uL (ref 4.0–10.5)
nRBC: 0 % (ref 0.0–0.2)

## 2022-09-25 LAB — COMPREHENSIVE METABOLIC PANEL
ALT: 11 U/L (ref 0–44)
AST: 12 U/L — ABNORMAL LOW (ref 15–41)
Albumin: 3.6 g/dL (ref 3.5–5.0)
Alkaline Phosphatase: 54 U/L (ref 38–126)
Anion gap: 5 (ref 5–15)
BUN: 12 mg/dL (ref 6–20)
CO2: 27 mmol/L (ref 22–32)
Calcium: 8.9 mg/dL (ref 8.9–10.3)
Chloride: 104 mmol/L (ref 98–111)
Creatinine, Ser: 0.53 mg/dL (ref 0.44–1.00)
GFR, Estimated: >60 mL/min (ref >60)
Glucose, Bld: 99 mg/dL (ref 70–99)
Potassium: 4.4 mmol/L (ref 3.5–5.1)
Sodium: 136 mmol/L (ref 135–145)
Total Bilirubin: 0.5 mg/dL (ref 0.3–1.2)
Total Protein: 7.4 g/dL (ref 6.5–8.1)

## 2022-09-25 LAB — URINALYSIS, COMPLETE (UACMP) WITH MICROSCOPIC
Bilirubin Urine: NEGATIVE
Glucose, UA: NEGATIVE mg/dL
Hgb urine dipstick: NEGATIVE
Ketones, ur: NEGATIVE mg/dL
Nitrite: NEGATIVE
Protein, ur: NEGATIVE mg/dL
Specific Gravity, Urine: 1.02 (ref 1.005–1.030)
pH: 8 (ref 5.0–8.0)

## 2022-09-25 LAB — MAGNESIUM: Magnesium: 2.2 mg/dL (ref 1.7–2.4)

## 2022-09-25 MED ORDER — BACLOFEN 10 MG PO TABS: 10.0000 mg | ORAL_TABLET | Freq: Three times a day (TID) | ORAL | 0 refills | Status: AC

## 2022-09-25 MED ORDER — KETOROLAC TROMETHAMINE 30 MG/ML IJ SOLN
30.0000 mg | Freq: Once | INTRAMUSCULAR | Status: AC
  Administered 2022-09-25: 30 mg via INTRAMUSCULAR
  Filled 2022-09-25: qty 1

## 2022-09-25 MED ORDER — LIDOCAINE 5 % EX PTCH
1.0000 | MEDICATED_PATCH | Freq: Once | CUTANEOUS | Status: DC
  Administered 2022-09-25: 1 via TRANSDERMAL
  Filled 2022-09-25: qty 1

## 2022-09-25 MED ORDER — PREDNISONE 10 MG (21) PO TBPK: ORAL_TABLET | ORAL | 0 refills | Status: DC

## 2022-09-25 NOTE — ED Triage Notes (Signed)
Pt presents to the ED via POV due to HA. Pt states she 2 months postpartum and had preeclampsia with baby. Pt also c/o back pain. Pt A&Ox4

## 2022-09-25 NOTE — ED Provider Notes (Signed)
[facility] Provider Note    Event Date/Time   First MD Initiated Contact with Patient 09/25/22 1117     (approximate)   History   Headache   HPI  Tammy Mendez is a 26 y.o. female with history of postpartum preeclampsia presents emergency department with low back pain.  No fever or chills.  No burning with urination.  No abdominal pain.      Physical Exam   Triage Vital Signs: ED Triage Vitals  Enc Vitals Group     BP 09/25/22 1115 136/88     Pulse Rate 09/25/22 1115 96     Resp 09/25/22 1115 18     Temp 09/25/22 1115 98.3 F (36.8 C)     Temp Source 09/25/22 1115 Oral     SpO2 09/25/22 1115 99 %     Weight 09/25/22 1114 (!) 317 lb (143.8 kg)     Height 09/25/22 1114 5\' 10"  (1.778 m)     Head Circumference --      Peak Flow --      Pain Score 09/25/22 1113 8     Pain Loc --      Pain Edu? --      Excl. in GC? --     Most recent vital signs: Vitals:   09/25/22 1115 09/25/22 1245  BP: 136/88 130/80  Pulse: 96 90  Resp: 18 18  Temp: 98.3 F (36.8 C)   SpO2: 99% 99%     General: Awake, no distress.   CV:  Good peripheral perfusion. regular rate and  rhythm Resp:  Normal effort. Lungs cta Abd:  No distention.   Other:  Lower back is tender along the musculature in the lumbar area, patient is able to walk without difficulty, 5 or 5 strength lower extremities   ED Results / Procedures / Treatments   Labs (all labs ordered are listed, but only abnormal results are displayed) Labs Reviewed  CBC WITH DIFFERENTIAL/PLATELET - Abnormal; Notable for the following components:      Result Value   Hemoglobin 9.8 (*)    HCT 33.3 (*)    MCV 72.9 (*)    MCH 21.4 (*)    MCHC 29.4 (*)    RDW 16.5 (*)    All other components within normal limits  COMPREHENSIVE METABOLIC PANEL - Abnormal; Notable for the following components:   AST 12 (*)    All other components within normal limits  URINALYSIS, COMPLETE (UACMP) WITH MICROSCOPIC -  Abnormal; Notable for the following components:   Color, Urine YELLOW (*)    APPearance HAZY (*)    Leukocytes,Ua TRACE (*)    Bacteria, UA RARE (*)    All other components within normal limits  MAGNESIUM     EKG     RADIOLOGY     PROCEDURES:   Procedures   MEDICATIONS ORDERED IN ED: Medications  lidocaine (LIDODERM) 5 % 1 patch (1 patch Transdermal Patch Applied 09/25/22 1215)  ketorolac (TORADOL) 30 MG/ML injection 30 mg (30 mg Intramuscular Given 09/25/22 1215)     IMPRESSION / MDM / ASSESSMENT AND PLAN / ED COURSE  I reviewed the triage vital signs and the nursing notes.                              Differential diagnosis includes, but is not limited to, eclampsia, lumbar strain, herniated disc,  Patient's presentation is most consistent with acute  complicated illness / injury requiring diagnostic workup.   Patient's labs are reassuring, have no concerns for her preeclampsia.  Do feel that this is more of a chronic back pain problem.  She is scheduled to go to pain clinic.  She takes Dilaudid on daily basis for her pain.  I explained to her cannot give her additional narcotics here in the ED due to her pain contract.  Also explained to her that cannot give her narcotic pain medication here she is driving also has her small child with her.  If she had someone to pick her up I could give her additional medications.  Patient states she is not having body pick her up.  She is given injection of Toradol 30 mg IM and a Lidoderm patch.  She was given a prescription for Sterapred and baclofen which should not make her drowsy and should be appropriate for her being at home with children.  She is in agreement treatment plan.  Discharged stable condition.       FINAL CLINICAL IMPRESSION(S) / ED DIAGNOSES   Final diagnoses:  Acute exacerbation of chronic low back pain     Rx / DC Orders   ED Discharge Orders          Ordered    predniSONE (STERAPRED UNI-PAK 21 TAB)  10 MG (21) TBPK tablet        09/25/22 1230    baclofen (LIORESAL) 10 MG tablet  3 times daily        09/25/22 1230             Note:  This document was prepared using Dragon voice recognition software and may include unintentional dictation errors.    Faythe Ghee, PA-C 09/25/22 1430    Sharyn Creamer, MD 09/25/22 [phone]

## 2022-09-25 NOTE — ED Provider Triage Note (Signed)
Emergency Medicine Provider Triage Evaluation Note  Tammy Mendez , a 25 y.o. female  was evaluated in triage.  Pt complains of preeclampsia, postpartum, back pain, headache  Review of Systems  Positive:  Negative:   Physical Exam  There were no vitals taken for this visit. Gen:   Awake, no distress   Resp:  Normal effort  MSK:   Moves extremities without difficulty  Other:    Medical Decision Making  Medically screening exam initiated at 11:10 AM.  Appropriate orders placed.  Tammy Mendez was informed that the remainder of the evaluation will be completed by another provider, this initial triage assessment does not replace that evaluation, and the importance of remaining in the ED until their evaluation is complete.     Faythe Ghee, PA-C 09/25/22 1111

## 2022-10-05 ENCOUNTER — Other Ambulatory Visit: Payer: Self-pay

## 2022-10-05 ENCOUNTER — Emergency Department
Admission: EM | Admit: 2022-10-05 | Discharge: 2022-10-05 | Disposition: A | Discharge status: Home / Self Care | Payer: 59 | Attending: Emergency Medicine | Admitting: Emergency Medicine

## 2022-10-05 DIAGNOSIS — G8929 Other chronic pain: Secondary | ICD-10-CM | POA: Diagnosis not present

## 2022-10-05 DIAGNOSIS — M545 Low back pain, unspecified: Secondary | ICD-10-CM | POA: Insufficient documentation

## 2022-10-05 DIAGNOSIS — M5459 Other low back pain: Secondary | ICD-10-CM | POA: Diagnosis not present

## 2022-10-05 MED ORDER — LIDOCAINE 5 % EX PTCH
1.0000 | MEDICATED_PATCH | CUTANEOUS | Status: DC
  Administered 2022-10-05: 1 via TRANSDERMAL
  Filled 2022-10-05: qty 1

## 2022-10-05 MED ORDER — METHOCARBAMOL 500 MG PO TABS: 500.0000 mg | ORAL_TABLET | Freq: Four times a day (QID) | ORAL | 0 refills | Status: DC | PRN

## 2022-10-05 MED ORDER — KETOROLAC TROMETHAMINE 30 MG/ML IJ SOLN
30.0000 mg | Freq: Once | INTRAMUSCULAR | Status: AC
  Administered 2022-10-05: 30 mg via INTRAMUSCULAR
  Filled 2022-10-05: qty 1

## 2022-10-05 NOTE — ED Triage Notes (Signed)
Pt here with severe back pain. Pt also has fluid on her legs per the pt. Pt is 2 months post partum. Pt had baby at 37 weeks and was pre-eclamptic. Pt has hx of herniated disc in her back. Pt ambulatory to triage.

## 2022-10-05 NOTE — ED Notes (Signed)
See triage note   Presents with lower back pain which is moving into both legs  Was dx'd with herniated disc  States pain is worse   and is moving into both legs  Also noticed that she is having some swelling to bLE

## 2022-10-05 NOTE — ED Provider Notes (Signed)
[facility] Provider Note    Event Date/Time   First MD Initiated Contact with Patient 10/05/22 1010     (approximate)   History   Back Pain   HPI  Tammy Mendez is a 26 y.o. female   presents to the ED with complaint of low back pain without recent injury.  Patient has a history of chronic low back pain and was seen in the emergency department at [facility] on 09/25/2022.  She denies any new symptoms.  She denies any incontinence of bowel or bladder or saddle anesthesias.  No abdominal pain.  Patient continues to ambulate without any assistance.  She is currently followed by her PCP at [facility] primary care at [facility] where she is prescribed Dilaudid 2 mg routinely.  She reports that she is completely out today and cannot get her prescription filled until tomorrow.  She also states that her PCP is considering sending her to a pain management clinic for her chronic back pain.  Patient has a history of anxiety, depression, gestational diabetes, chronic back pain      Physical Exam   Triage Vital Signs: ED Triage Vitals  Enc Vitals Group     BP 10/05/22 0939 138/89     Pulse Rate 10/05/22 0939 88     Resp 10/05/22 0939 20     Temp 10/05/22 0939 97.8 F (36.6 C)     Temp Source 10/05/22 0939 Oral     SpO2 10/05/22 0939 100 %     Weight 10/05/22 0940 (!) 317 lb 0.3 oz (143.8 kg)     Height 10/05/22 0940 5\' 10"  (1.778 m)     Head Circumference --      Peak Flow --      Pain Score 10/05/22 0940 10     Pain Loc --      Pain Edu? --      Excl. in GC? --     Most recent vital signs: Vitals:   10/05/22 0939 10/05/22 1207  BP: 138/89 (!) 142/88  Pulse: 88 80  Resp: 20 17  Temp: 97.8 F (36.6 C) 98.1 F (36.7 C)  SpO2: 100% 98%     General: Awake, no distress.  CV:  Good peripheral perfusion.  Heart regular rate and rhythm. Resp:  Normal effort.  Clear bilaterally. Abd:  No distention.  Soft, nontender, bowel sounds present x 4 quadrants. Other:  On  palpation of the lumbar spine there is tenderness noted at approximately L5-S1 area and paravertebral muscles bilaterally.  Range of motion is slow and guarded secondary to discomfort.  Straight leg raises are approximately 20 degrees bilaterally.  Good muscle strength at 5/5 bilaterally.  Skin is intact no evidence of abrasions or discoloration present.   ED Results / Procedures / Treatments   Labs (all labs ordered are listed, but only abnormal results are displayed) Labs Reviewed - No data to display    RADIOLOGY  Reviewed MRI report from 04/2022 and 01/2022.   PROCEDURES:  Critical Care performed:   Procedures   MEDICATIONS ORDERED IN ED: Medications  lidocaine (LIDODERM) 5 % 1 patch (1 patch Transdermal Patch Applied 10/05/22 1140)  ketorolac (TORADOL) 30 MG/ML injection 30 mg (30 mg Intramuscular Given 10/05/22 1141)     IMPRESSION / MDM / ASSESSMENT AND PLAN / ED COURSE  I reviewed the triage vital signs and the nursing notes.   Differential diagnosis includes, but is not limited to, chronic back pain, encounter for pain medication,  acute exacerbation of chronic back pain.   26 year old female presents to the ED with complaint of exacerbation of her chronic back pain.  Patient has a history of low back pain that is currently treated by her PCP in [place].  Patient states that her PCP is on maternity leave currently.  She is here for pain management and also because she is unable to pick up her prescription for Dilaudid until tomorrow.  I discussed that most likely this reflects that she has been taking her medication more frequently than what has been prescribed.  We discussed referral to the pain clinic which she states her PCP is already doing.  On physical exam there was no findings that have not already been experienced by the patient.  No incontinence of bowel or bladder or saddle anesthesias to suggest cauda equina.  I discussed with patient that she would be  getting an injection of Toradol while in the emergency department and a Lidoderm patch.  A prescription for methocarbamol was sent to the pharmacy for her to take as needed for muscle spasms that she states she is no longer have this prescription.  We also discussed the need for pain management.  Patient states she will pick up her prescription tomorrow for her narcotic as planned.     Patient's presentation is most consistent with acute, uncomplicated illness.  FINAL CLINICAL IMPRESSION(S) / ED DIAGNOSES   Final diagnoses:  Chronic midline low back pain without sciatica     Rx / DC Orders   ED Discharge Orders          Ordered    methocarbamol (ROBAXIN) 500 MG tablet  Every 6 hours PRN        10/05/22 1143             Note:  This document was prepared using Dragon voice recognition software and may include unintentional dictation errors.   Johnn Hai, PA-C 10/05/22 1328    Duffy Bruce, MD 10/08/22 [phone]

## 2022-10-05 NOTE — ED Triage Notes (Signed)
First Nurse Note:  C/O back pain for several months.  Patient states she is 2 months postpartum and now c/o lower extremity swelling.   Patient is AAOx3.  Skin warm and dry. Seen through ED on 11/20 for c/o headache.  AAOx3.  Skin warm and dry. No SOB/ DOE.  NAD

## 2022-10-05 NOTE — Discharge Instructions (Addendum)
Get your pain medication as scheduled tomorrow and call your primary care provider if any continued problems.  Also see about your scheduled appointment with the pain management clinic that your primary care provider was working on.  A prescription for methocarbamol was sent to the pharmacy.  Do not take this medication and drive or operate machinery as it could cause drowsiness in addition to your pain medication and increase your risk for injury. Can also call the office that your primary care provider is in and speak to one of the nurses to get the referral to the pain management clinic.

## 2022-11-30 ENCOUNTER — Emergency Department
Admission: EM | Admit: 2022-11-30 | Discharge: 2022-11-30 | Disposition: A | Discharge status: Home / Self Care | Payer: 59 | Attending: Emergency Medicine | Admitting: Emergency Medicine

## 2022-11-30 ENCOUNTER — Other Ambulatory Visit: Payer: Self-pay

## 2022-11-30 DIAGNOSIS — R519 Headache, unspecified: Secondary | ICD-10-CM | POA: Diagnosis not present

## 2022-11-30 DIAGNOSIS — M5442 Lumbago with sciatica, left side: Secondary | ICD-10-CM | POA: Diagnosis not present

## 2022-11-30 DIAGNOSIS — B349 Viral infection, unspecified: Secondary | ICD-10-CM | POA: Insufficient documentation

## 2022-11-30 DIAGNOSIS — M5440 Lumbago with sciatica, unspecified side: Secondary | ICD-10-CM | POA: Insufficient documentation

## 2022-11-30 DIAGNOSIS — Z1152 Encounter for screening for COVID-19: Secondary | ICD-10-CM | POA: Diagnosis not present

## 2022-11-30 DIAGNOSIS — G8929 Other chronic pain: Secondary | ICD-10-CM | POA: Diagnosis not present

## 2022-11-30 LAB — RESP PANEL BY RT-PCR (RSV, FLU A&B, COVID)  RVPGX2
Influenza A by PCR: NEGATIVE
Influenza B by PCR: NEGATIVE
Resp Syncytial Virus by PCR: NEGATIVE
SARS Coronavirus 2 by RT PCR: NEGATIVE

## 2022-11-30 MED ORDER — LIDOCAINE 5 % EX PTCH: 1.0000 | MEDICATED_PATCH | Freq: Two times a day (BID) | CUTANEOUS | 0 refills | Status: DC

## 2022-11-30 MED ORDER — LIDOCAINE 5 % EX PTCH
1.0000 | MEDICATED_PATCH | CUTANEOUS | Status: DC
  Administered 2022-11-30: 1 via TRANSDERMAL
  Filled 2022-11-30: qty 1

## 2022-11-30 NOTE — ED Notes (Signed)
Pt with c/o back pain from herniated disc, and states she feels like she has a cold and is stuffy.

## 2022-11-30 NOTE — ED Triage Notes (Addendum)
Pt states coming in with lower back pain and a headache. Pt states history of a lower disk herniation.  Pt is saying that daughter was sick and son is now sick with cough and runny nose.

## 2022-11-30 NOTE — ED Provider Notes (Signed)
[facility] Provider Note    None    (approximate)   History   Back Pain and Headache   HPI  Tammy Mendez is a 27 y.o. female presents to the ED with complaint of "not feeling well" x 3 days.   Patient reports that she has a young daughter at home who has been sick and she also has her small infant here with similar symptoms.  Patient also reports that the daughter and son have had cough and runny nose.  She is unaware of any known exposure to COVID or influenza.  Patient also complains of a headache and low back pain.  She has a history of low back disc "herniation" and chronic back pain.  Her PCP is referring her to a pain management group.  She denies any recent injury to her lower back or urinary symptoms.      Physical Exam   Triage Vital Signs: ED Triage Vitals  Enc Vitals Group     BP 11/30/22 0932 133/79     Pulse Rate 11/30/22 0932 (!) 117     Resp 11/30/22 0932 18     Temp 11/30/22 0932 98.6 F (37 C)     Temp Source 11/30/22 0932 Oral     SpO2 11/30/22 0932 97 %     Weight 11/30/22 0937 (!) 310 lb (140.6 kg)     Height 11/30/22 0937 5\' 10"  (1.778 m)     Head Circumference --      Peak Flow --      Pain Score 11/30/22 0936 9     Pain Loc --      Pain Edu? --      Excl. in [place]? --     Most recent vital signs: Vitals:   11/30/22 0932 11/30/22 1313  BP: 133/79 118/88  Pulse: (!) 117 88  Resp: 18 18  Temp: 98.6 F (37 C) 97.9 F (36.6 C)  SpO2: 97% 98%     General: Awake, no distress.  CV:  Good peripheral perfusion.  Heart regular rate and rhythm. Resp:  Normal effort.  Lungs are clear bilaterally. Abd:  No distention.  Soft. Other:  On examination of the back there is no gross deformity and no point tenderness on palpation of the lumbar spine.  There is more tenderness noted to the paravertebral muscles bilaterally.  Straight leg raises are negative.  Patient is able to stand and ambulate without any assistance.   ED  Results / Procedures / Treatments   Labs (all labs ordered are listed, but only abnormal results are displayed) Labs Reviewed  RESP PANEL BY RT-PCR (RSV, FLU A&B, COVID)  [id number]      PROCEDURES:  Critical Care performed:   Procedures   MEDICATIONS ORDERED IN ED: Medications  lidocaine (LIDODERM) 5 % 1 patch (1 patch Transdermal Patch Applied 11/30/22 1311)     IMPRESSION / MDM / [place] / ED COURSE  I reviewed the triage vital signs and the nursing notes.   Differential diagnosis includes, but is not limited to, chronic low back pain, influenza, COVID, RSV.  27 year old female presents to the ED with complaint of low back pain for which she is already been seen and referred to pain management clinic.  She currently is taking Dilaudid prescribed by her PCP.  Patient was reassured with respiratory swab being negative for COVID, influenza and RSV.  No signs or symptoms on exam that are suspicious for cauda equina.  A Lidoderm patch was applied to patient's back.  A prescription for the same was sent to her pharmacy.  She is to call her PCP to see if the referral has already been made for the pain management group for continuation of her chronic back pain management.      Patient's presentation is most consistent with acute complicated illness / injury requiring diagnostic workup.  FINAL CLINICAL IMPRESSION(S) / ED DIAGNOSES   Final diagnoses:  Viral illness  Chronic low back pain with sciatica, sciatica laterality unspecified, unspecified back pain laterality     Rx / DC Orders   ED Discharge Orders          Ordered    lidocaine (LIDODERM) 5 %  Every 12 hours        11/30/22 1253             Note:  This document was prepared using Dragon voice recognition software and may include unintentional dictation errors.   Johnn Hai, PA-C 11/30/22 1338    Delman Kitten, MD 11/30/22 [id number]

## 2022-11-30 NOTE — Discharge Instructions (Addendum)
Follow up with your primary care provider to see if referral is needed for pain clinic

## 2023-02-28 ENCOUNTER — Emergency Department
Admission: EM | Admit: 2023-02-28 | Discharge: 2023-02-28 | Disposition: A | Discharge status: Home / Self Care | Payer: Medicaid Other | Attending: Emergency Medicine | Admitting: Emergency Medicine

## 2023-02-28 ENCOUNTER — Emergency Department: Payer: Medicaid Other

## 2023-02-28 ENCOUNTER — Other Ambulatory Visit: Payer: Self-pay

## 2023-02-28 DIAGNOSIS — N939 Abnormal uterine and vaginal bleeding, unspecified: Secondary | ICD-10-CM | POA: Insufficient documentation

## 2023-02-28 DIAGNOSIS — O039 Complete or unspecified spontaneous abortion without complication: Secondary | ICD-10-CM | POA: Diagnosis present

## 2023-02-28 LAB — CBC WITH DIFFERENTIAL/PLATELET
Abs Immature Granulocytes: 0.03 10*3/uL (ref 0.00–0.07)
Basophils Absolute: 0 10*3/uL (ref 0.0–0.1)
Basophils Relative: 0 %
Eosinophils Absolute: 0.2 10*3/uL (ref 0.0–0.5)
Eosinophils Relative: 1 %
HCT: 33.3 % — ABNORMAL LOW (ref 36.0–46.0)
Hemoglobin: 9.6 g/dL — ABNORMAL LOW (ref 12.0–15.0)
Immature Granulocytes: 0 %
Lymphocytes Relative: 22 %
Lymphs Abs: 2.4 10*3/uL (ref 0.7–4.0)
MCH: 20.5 pg — ABNORMAL LOW (ref 26.0–34.0)
MCHC: 28.8 g/dL — ABNORMAL LOW (ref 30.0–36.0)
MCV: 71 fL — ABNORMAL LOW (ref 80.0–100.0)
Monocytes Absolute: 0.9 10*3/uL (ref 0.1–1.0)
Monocytes Relative: 8 %
Neutro Abs: 7.6 10*3/uL (ref 1.7–7.7)
Neutrophils Relative %: 69 %
Platelets: 373 10*3/uL (ref 150–400)
RBC: 4.69 MIL/uL (ref 3.87–5.11)
RDW: 16.9 % — ABNORMAL HIGH (ref 11.5–15.5)
WBC: 11 10*3/uL — ABNORMAL HIGH (ref 4.0–10.5)
nRBC: 0 % (ref 0.0–0.2)

## 2023-02-28 LAB — COMPREHENSIVE METABOLIC PANEL
ALT: 9 U/L (ref 0–44)
AST: 14 U/L — ABNORMAL LOW (ref 15–41)
Albumin: 3.5 g/dL (ref 3.5–5.0)
Alkaline Phosphatase: 48 U/L (ref 38–126)
Anion gap: 7 (ref 5–15)
BUN: 15 mg/dL (ref 6–20)
CO2: 26 mmol/L (ref 22–32)
Calcium: 8.8 mg/dL — ABNORMAL LOW (ref 8.9–10.3)
Chloride: 101 mmol/L (ref 98–111)
Creatinine, Ser: 0.49 mg/dL (ref 0.44–1.00)
GFR, Estimated: >60 mL/min (ref >60)
Glucose, Bld: 108 mg/dL — ABNORMAL HIGH (ref 70–99)
Potassium: 3.7 mmol/L (ref 3.5–5.1)
Sodium: 134 mmol/L — ABNORMAL LOW (ref 135–145)
Total Bilirubin: 0.3 mg/dL (ref 0.3–1.2)
Total Protein: 7.9 g/dL (ref 6.5–8.1)

## 2023-02-28 LAB — HCG, QUANTITATIVE, PREGNANCY: hCG, Beta Chain, Quant, S: 6830 m[IU]/mL — ABNORMAL HIGH (ref <5)

## 2023-02-28 MED ORDER — ONDANSETRON HCL 4 MG/2ML IJ SOLN
4.0000 mg | Freq: Once | INTRAMUSCULAR | Status: AC
  Administered 2023-02-28: 4 mg via INTRAVENOUS
  Filled 2023-02-28: qty 2

## 2023-02-28 MED ORDER — FENTANYL CITRATE PF 50 MCG/ML IJ SOSY
50.0000 ug | PREFILLED_SYRINGE | Freq: Once | INTRAMUSCULAR | Status: AC
  Administered 2023-02-28: 50 ug via INTRAVENOUS
  Filled 2023-02-28: qty 1

## 2023-02-28 MED ORDER — SODIUM CHLORIDE 0.9 % IV BOLUS
1000.0000 mL | Freq: Once | INTRAVENOUS | Status: AC
  Administered 2023-02-28: 1000 mL via INTRAVENOUS

## 2023-02-28 MED ORDER — HYDROMORPHONE HCL 1 MG/ML IJ SOLN
0.5000 mg | Freq: Once | INTRAMUSCULAR | Status: AC
  Administered 2023-02-28: 0.5 mg via INTRAVENOUS
  Filled 2023-02-28: qty 0.5

## 2023-02-28 NOTE — ED Provider Notes (Signed)
[facility] Provider Note  Patient Contact: 8:49 PM (approximate)   History   Vaginal Bleeding   HPI  Tammy Mendez is a 27 y.o. female who presents the emergency department complaining of vaginal bleeding and pregnancy.  Patient is roughly [redacted] weeks pregnant.  She started having vaginal bleeding yesterday, went to [facility] had a reassuring lab workup but did not have ultrasound.  Outpatient ultrasound was performed which showed a subchorionic hematoma with intrauterine pregnancy measuring 4 weeks.  Patient is here for ongoing vaginal bleeding.  No emesis, diarrhea, constipation, urinary changes.     Physical Exam   Triage Vital Signs: ED Triage Vitals  Enc Vitals Group     BP 02/28/23 2012 129/84     Pulse Rate 02/28/23 2012 (!) 115     Resp 02/28/23 2012 20     Temp 02/28/23 2012 98.6 F (37 C)     Temp Source 02/28/23 2012 Oral     SpO2 02/28/23 2012 100 %     Weight 02/28/23 2014 (!) 317 lb (143.8 kg)     Height 02/28/23 2014  (1.753 m)     Head Circumference --      Peak Flow --      Pain Score 02/28/23 2014 10     Pain Loc --      Pain Edu? --      Excl. in [place]? --     Most recent vital signs: Vitals:   02/28/23 2012  BP: 129/84  Pulse: (!) 115  Resp: 20  Temp: 98.6 F (37 C)  SpO2: 100%     General: Alert and in no acute distress.  Cardiovascular:  Good peripheral perfusion Respiratory: Normal respiratory effort without tachypnea or retractions. Lungs CTAB.  Gastrointestinal: Bowel sounds 4 quadrants. Soft and nontender to palpation. No guarding or rigidity. No palpable masses. No distention. No CVA tenderness. Musculoskeletal: Full range of motion to all extremities.  Neurologic:  No gross focal neurologic deficits are appreciated.  Skin:   No rash noted Other:   ED Results / Procedures / Treatments   Labs (all labs ordered are listed, but only abnormal results are displayed) Labs Reviewed  CBC WITH  DIFFERENTIAL/PLATELET - Abnormal; Notable for the following components:      Result Value   WBC 11.0 (*)    Hemoglobin 9.6 (*)    HCT 33.3 (*)    MCV 71.0 (*)    MCH 20.5 (*)    MCHC 28.8 (*)    RDW 16.9 (*)    All other components within normal limits  COMPREHENSIVE METABOLIC PANEL - Abnormal; Notable for the following components:   Sodium 134 (*)    Glucose, Bld 108 (*)    Calcium 8.8 (*)    AST 14 (*)    All other components within normal limits  HCG, QUANTITATIVE, PREGNANCY - Abnormal; Notable for the following components:   hCG, Beta Chain, Quant, S 6,830 (*)    All other components within normal limits  URINALYSIS, ROUTINE W REFLEX MICROSCOPIC  POC URINE PREG, ED     EKG     RADIOLOGY  I personally viewed, evaluated, and interpreted these images as part of my medical decision making, as well as reviewing the written report by the radiologist.  ED Provider Interpretation: Ultrasound reveals no evidence of intrauterine pregnancy.  Patient had ultrasound performed earlier today for comparison which revealed intrauterine pregnancy with single pregnancy.  Thickened endometrium consistent with retained  products of conception.  US OB LESS THAN 14 WEEKS WITH OB TRANSVAGINAL  Result Date: 02/28/2023 CLINICAL DATA:  Vaginal bleeding. EXAM: OBSTETRIC <14 WK [place] AND TRANSVAGINAL OB US TECHNIQUE: Both transabdominal and transvaginal ultrasound examinations were performed for complete evaluation of the gestation as well as the maternal uterus, adnexal regions, and pelvic cul-de-sac. Transvaginal technique was performed to assess early pregnancy. COMPARISON:  None Available. FINDINGS: Intrauterine gestational sac: None Yolk sac:  Not Visualized. Embryo:  Not Visualized. Cardiac Activity: Not Visualized. Heart Rate: N/A  bpm Maternal uterus/adnexae: The uterus measures 11.0 cm x 5.2 cm x 6.2 cm (volume 184.00 mL). The endometrium measures 23 mm in thickness and is heterogeneous in  appearance. An area of increased vascularity is noted within the endometrium. The right ovary measures 2.9 cm x 1.8 cm x 1.4 cm and is normal in appearance. The left ovary measures 3.2 cm x 2.0 cm x 2.5 cm. A 2.0 cm x 1.7 cm x 2.0 cm left corpus luteum cyst is noted. No pelvic free fluid is seen. IMPRESSION: 1. No evidence of an intrauterine pregnancy. 2. Thickened, heterogeneous endometrium with additional findings suggestive of retained products of conception. Electronically Signed   By: Aram Candela M.D.   On: 02/28/2023 22:12    PROCEDURES:  Critical Care performed: No  Procedures   MEDICATIONS ORDERED IN ED: Medications  HYDROmorphone (DILAUDID) injection 0.5 mg (has no administration in time range)  ondansetron (ZOFRAN) injection 4 mg (has no administration in time range)  sodium chloride 0.9 % bolus 1,000 mL (0 mLs Intravenous Stopped 02/28/23 2221)  fentaNYL (SUBLIMAZE) injection 50 mcg (50 mcg Intravenous Given 02/28/23 2113)     IMPRESSION / MDM / ASSESSMENT AND PLAN / ED COURSE  I reviewed the triage vital signs and the nursing notes.                                 Differential diagnosis includes, but is not limited to, miscarriage, subchorionic hematoma, implantation bleeding  Patient's presentation is most consistent with acute presentation with potential threat to life or bodily function.   Patient's diagnosis is consistent with miscarriage, vaginal bleeding.  Patient presents emergency department with vaginal bleeding.  Bleeding started yesterday, became heavier this evening.  She was seen at [facility] last night, had outpatient ultrasound performed this morning.  Ultrasound earlier revealed subchorionic hematoma with single intrauterine pregnancy.  Ultrasound tonight reveals no intrauterine pregnancy with evidence of retained products of conception consistent with miscarriage.  Patient does have a hCG that is slightly falling compared to last night further  supporting miscarriage.  Hemoglobin is at 9.8 compared to 9.2 yesterday so no indication of bleeding to the point of concern for worsening anemia/need for transfusion.  At this time patient does have an OB/GYN and I recommend follow-up..  Precautions discussed with the patient.  Patient is given ED precautions to return to the ED for any worsening or new symptoms.     FINAL CLINICAL IMPRESSION(S) / ED DIAGNOSES   Final diagnoses:  Miscarriage  Vaginal bleeding     Rx / DC Orders   ED Discharge Orders     None        Note:  This document was prepared using Dragon voice recognition software and may include unintentional dictation errors.   Lanette Hampshire 02/28/23 2242    Corena Herter, MD 02/28/23 2310

## 2023-02-28 NOTE — ED Notes (Signed)
Pt provided personal hygiene supplies and mesh underwear. Pt verbalized understanding of D/C information.

## 2023-02-28 NOTE — ED Triage Notes (Signed)
Pt presents to ER with c/o vaginal bleeding that started as spotting around one week ago, but states in the last few hours, the bleeding has become heavier.  Pt states she was seen at [facility] ED earlier today, and was told she might have a subchorionic hemorrhage or threatened miscarriage.  Pt endorses pain to lower abdomen and back.  Pt states she has gone through appx 6-8 pads in last hour.  Pt is otherwise A&O x4 and in NAD.

## 2023-03-17 ENCOUNTER — Other Ambulatory Visit: Payer: Self-pay

## 2023-03-17 ENCOUNTER — Encounter (HOSPITAL_COMMUNITY): Payer: Self-pay | Admitting: Emergency Medicine

## 2023-03-17 ENCOUNTER — Emergency Department (HOSPITAL_COMMUNITY)
Admission: EM | Admit: 2023-03-17 | Discharge: 2023-03-17 | Disposition: A | Discharge status: Home / Self Care | Payer: Medicaid Other | Attending: Emergency Medicine | Admitting: Emergency Medicine

## 2023-03-17 DIAGNOSIS — R1031 Right lower quadrant pain: Secondary | ICD-10-CM | POA: Diagnosis not present

## 2023-03-17 DIAGNOSIS — M545 Low back pain, unspecified: Secondary | ICD-10-CM | POA: Insufficient documentation

## 2023-03-17 DIAGNOSIS — R112 Nausea with vomiting, unspecified: Secondary | ICD-10-CM | POA: Insufficient documentation

## 2023-03-17 DIAGNOSIS — R1032 Left lower quadrant pain: Secondary | ICD-10-CM | POA: Diagnosis not present

## 2023-03-17 DIAGNOSIS — Z91040 Latex allergy status: Secondary | ICD-10-CM | POA: Diagnosis not present

## 2023-03-17 DIAGNOSIS — G8929 Other chronic pain: Secondary | ICD-10-CM | POA: Insufficient documentation

## 2023-03-17 LAB — URINALYSIS, ROUTINE W REFLEX MICROSCOPIC
Bilirubin Urine: NEGATIVE
Glucose, UA: NEGATIVE mg/dL
Ketones, ur: NEGATIVE mg/dL
Leukocytes,Ua: NEGATIVE
Nitrite: NEGATIVE
Protein, ur: NEGATIVE mg/dL
Specific Gravity, Urine: 1.013 (ref 1.005–1.030)
pH: 6 (ref 5.0–8.0)

## 2023-03-17 LAB — CBC WITH DIFFERENTIAL/PLATELET
Abs Immature Granulocytes: 0.05 10*3/uL (ref 0.00–0.07)
Basophils Absolute: 0 10*3/uL (ref 0.0–0.1)
Basophils Relative: 0 %
Eosinophils Absolute: 0 10*3/uL (ref 0.0–0.5)
Eosinophils Relative: 0 %
HCT: 31.5 % — ABNORMAL LOW (ref 36.0–46.0)
Hemoglobin: 9.2 g/dL — ABNORMAL LOW (ref 12.0–15.0)
Immature Granulocytes: 0 %
Lymphocytes Relative: 16 %
Lymphs Abs: 1.9 10*3/uL (ref 0.7–4.0)
MCH: 20.3 pg — ABNORMAL LOW (ref 26.0–34.0)
MCHC: 29.2 g/dL — ABNORMAL LOW (ref 30.0–36.0)
MCV: 69.5 fL — ABNORMAL LOW (ref 80.0–100.0)
Monocytes Absolute: 1.3 10*3/uL — ABNORMAL HIGH (ref 0.1–1.0)
Monocytes Relative: 11 %
Neutro Abs: 8.3 10*3/uL — ABNORMAL HIGH (ref 1.7–7.7)
Neutrophils Relative %: 73 %
Platelets: 114 10*3/uL — ABNORMAL LOW (ref 150–400)
RBC: 4.53 MIL/uL (ref 3.87–5.11)
RDW: 17 % — ABNORMAL HIGH (ref 11.5–15.5)
WBC: 11.5 10*3/uL — ABNORMAL HIGH (ref 4.0–10.5)
nRBC: 0 % (ref 0.0–0.2)

## 2023-03-17 LAB — COMPREHENSIVE METABOLIC PANEL
ALT: 10 U/L (ref 0–44)
AST: 11 U/L — ABNORMAL LOW (ref 15–41)
Albumin: 3.7 g/dL (ref 3.5–5.0)
Alkaline Phosphatase: 47 U/L (ref 38–126)
Anion gap: 10 (ref 5–15)
BUN: 10 mg/dL (ref 6–20)
CO2: 22 mmol/L (ref 22–32)
Calcium: 8.8 mg/dL — ABNORMAL LOW (ref 8.9–10.3)
Chloride: 99 mmol/L (ref 98–111)
Creatinine, Ser: 0.55 mg/dL (ref 0.44–1.00)
GFR, Estimated: >60 mL/min (ref >60)
Glucose, Bld: 96 mg/dL (ref 70–99)
Potassium: 3.8 mmol/L (ref 3.5–5.1)
Sodium: 131 mmol/L — ABNORMAL LOW (ref 135–145)
Total Bilirubin: 0.7 mg/dL (ref 0.3–1.2)
Total Protein: 8 g/dL (ref 6.5–8.1)

## 2023-03-17 LAB — LIPASE, BLOOD: Lipase: 24 U/L (ref 11–51)

## 2023-03-17 LAB — HCG, QUANTITATIVE, PREGNANCY: hCG, Beta Chain, Quant, S: 4 m[IU]/mL (ref <5)

## 2023-03-17 MED ORDER — SODIUM CHLORIDE 0.9 % IV BOLUS
1000.0000 mL | Freq: Once | INTRAVENOUS | Status: AC
  Administered 2023-03-17: 1000 mL via INTRAVENOUS

## 2023-03-17 MED ORDER — ONDANSETRON HCL 4 MG PO TABS: 4.0000 mg | ORAL_TABLET | Freq: Four times a day (QID) | ORAL | 0 refills | Status: AC

## 2023-03-17 MED ORDER — METHOCARBAMOL 500 MG PO TABS
500.0000 mg | ORAL_TABLET | Freq: Once | ORAL | Status: AC
  Administered 2023-03-17: 500 mg via ORAL
  Filled 2023-03-17: qty 1

## 2023-03-17 MED ORDER — HYDROMORPHONE HCL 1 MG/ML IJ SOLN
0.5000 mg | Freq: Once | INTRAMUSCULAR | Status: AC
  Administered 2023-03-17: 0.5 mg via INTRAVENOUS
  Filled 2023-03-17: qty 0.5

## 2023-03-17 MED ORDER — MORPHINE SULFATE (PF) 4 MG/ML IV SOLN
4.0000 mg | Freq: Once | INTRAVENOUS | Status: AC
  Administered 2023-03-17: 4 mg via INTRAVENOUS
  Filled 2023-03-17: qty 1

## 2023-03-17 MED ORDER — ONDANSETRON HCL 4 MG/2ML IJ SOLN
4.0000 mg | Freq: Once | INTRAMUSCULAR | Status: AC
  Administered 2023-03-17: 4 mg via INTRAVENOUS
  Filled 2023-03-17: qty 2

## 2023-03-17 MED ORDER — KETOROLAC TROMETHAMINE 30 MG/ML IJ SOLN
15.0000 mg | Freq: Once | INTRAMUSCULAR | Status: AC
  Administered 2023-03-17: 15 mg via INTRAVENOUS
  Filled 2023-03-17: qty 1

## 2023-03-17 NOTE — Discharge Instructions (Signed)
It was a pleasure taking care of you, You were seen today for increase in her chronic back pain.  Follow-up with neurosurgery.  Follow-up with your primary care doctor as well.  You are given Zofran for your nausea.  Your blood work is reassuring, you have no abdominal pain at this time.  If you develop abdominal pain, fevers, persistent vomiting or other worsening symptoms come back to the ER right away please.

## 2023-03-17 NOTE — ED Triage Notes (Signed)
Pt via POV c/o lower back and bilateral leg pain, chronic but worse since yesterday morning. Pt has herniated discs; pain rated 10/10. Ambulatory but seems uncomfortable while walking.

## 2023-03-17 NOTE — ED Provider Notes (Signed)
[place] [facility]GENC[facility] DEPARTMENT AT [facility] Provider Note   CSN: [id number] Arrival date & time: 03/17/23  1214     History  Chief Complaint  Patient presents with   Back Pain    Tammy Mendez is a 27 y.o. female.  Past medical history of chronic back pain due to herniated disc, previously on Dilaudid but recently switched to oxycodone for her chronic pain.  She presents to the ER today complaining of worsening pain in her low back since yesterday radiating to bilateral legs, primarily anteriorly somewhat in her thighs but mostly from the knees down.  She able to ambulate, denies any saddle anesthesia or paresthesia, no bowel or bladder incontinence.  She is not diabetic, denies drug use specifically IV drug abuse, no history immunosuppression, denies fevers or chills.  She does note nausea vomiting and diarrhea for the past 24 hours as well.  She reports some mild lower abdominal pain.  She relates this to some ongoing vaginal bleeding relating to a spontaneous miscarriage diagnosed on 426 at [facility]   Back Pain      Home Medications Prior to Admission medications   Medication Sig Start Date End Date Taking? Authorizing Provider  ferrous sulfate 325 (65 FE) MG tablet Take 1 tablet by mouth daily. 07/18/22 07/18/23 Yes [provider]  ondansetron (ZOFRAN) 4 MG tablet Take 1 tablet (4 mg total) by mouth every 6 (six) hours. 03/17/23  Yes Maryn Freelove A, PA-C  oxyCODONE (OXY IR/ROXICODONE) 5 MG immediate release tablet Take 5 mg by mouth 3 (three) times daily. 02/16/23  Yes [provider]  sertraline (ZOLOFT) 100 MG tablet Take 150 mg by mouth daily. 03/02/22   [provider]      Allergies    Latex and Phenergan [promethazine]    Review of Systems   Review of Systems  Musculoskeletal:  Positive for back pain.    Physical Exam Updated Vital Signs BP 121/74 (BP Location: Right Arm)   Pulse 90   Temp 98.1 F  (36.7 C) (Oral)   Resp 16   Ht 5\' 10"  (1.778 m)   Wt (!) 142.4 kg   LMP 01/29/2023 (Approximate) Comment: Miscarriage in process per pt  SpO2 100%   Breastfeeding No   BMI 45.05 kg/m  Physical Exam Vitals and nursing note reviewed.  Constitutional:      General: She is not in acute distress.    Appearance: She is well-developed.  HENT:     Head: Normocephalic and atraumatic.  Eyes:     Conjunctiva/sclera: Conjunctivae normal.  Cardiovascular:     Rate and Rhythm: Normal rate and regular rhythm.     Heart sounds: No murmur heard. Pulmonary:     Effort: Pulmonary effort is normal. No respiratory distress.     Breath sounds: Normal breath sounds.  Abdominal:     Palpations: Abdomen is soft.     Tenderness: There is abdominal tenderness in the right lower quadrant, suprapubic area and left lower quadrant. There is no right CVA tenderness, left CVA tenderness, guarding or rebound. Negative signs include Murphy's sign, Rovsing's sign and McBurney's sign.  Musculoskeletal:        General: No swelling.     Cervical back: Neck supple.  Skin:    General: Skin is warm and dry.     Capillary Refill: Capillary refill takes less than 2 seconds.  Neurological:     Mental Status: She is alert.  Psychiatric:  Mood and Affect: Mood normal.     ED Results / Procedures / Treatments   Labs (all labs ordered are listed, but only abnormal results are displayed) Labs Reviewed  CBC WITH DIFFERENTIAL/PLATELET - Abnormal; Notable for the following components:      Result Value   WBC 11.5 (*)    Hemoglobin 9.2 (*)    HCT 31.5 (*)    MCV 69.5 (*)    MCH 20.3 (*)    MCHC 29.2 (*)    RDW 17.0 (*)    Platelets 114 (*)    Neutro Abs 8.3 (*)    Monocytes Absolute 1.3 (*)    All other components within normal limits  COMPREHENSIVE METABOLIC PANEL - Abnormal; Notable for the following components:   Sodium 131 (*)    Calcium 8.8 (*)    AST 11 (*)    All other components within normal  limits  URINALYSIS, ROUTINE W REFLEX MICROSCOPIC - Abnormal; Notable for the following components:   APPearance HAZY (*)    Hgb urine dipstick MODERATE (*)    Bacteria, UA RARE (*)    All other components within normal limits  LIPASE, BLOOD  HCG, QUANTITATIVE, PREGNANCY    EKG None  Radiology No results found.  Procedures Procedures    Medications Ordered in ED Medications  sodium chloride 0.9 % bolus 1,000 mL (0 mLs Intravenous Stopped 03/17/23 1521)  ondansetron (ZOFRAN) injection 4 mg (4 mg Intravenous Given 03/17/23 1340)  morphine (PF) 4 MG/ML injection 4 mg (4 mg Intravenous Given 03/17/23 1422)  ketorolac (TORADOL) 30 MG/ML injection 15 mg (15 mg Intravenous Given 03/17/23 1522)  methocarbamol (ROBAXIN) tablet 500 mg (500 mg Oral Given 03/17/23 1521)  HYDROmorphone (DILAUDID) injection 0.5 mg (0.5 mg Intravenous Given 03/17/23 1700)    ED Course/ Medical Decision Making/ A&P                             Medical Decision Making This patient presents to the ED for concern of low back pain, nausea, vomiting, diarrhea, this involves an extensive number of treatment options, and is a complaint that carries with it a high risk of complications and morbidity.  The differential diagnosis includes missed abortion, septic abortion, chronic low back pain, cauda equina, HNP, paraspinous abscess, muscle strain, other   Co morbidities that complicate the patient evaluation  Obesity, bulging discs in lumbar spine   Additional history obtained:  Additional history obtained from EMR External records from outside source obtained and reviewed including Recent ED visit (02/28/23) at [facility] for dx of miscarriage MRI results from 03/14/2022 showing   IMPRESSION: 1. Large right central/subarticular disc extrusion at L4-L5 which impinges the traversing L5 nerve root. 2. Mild disc protrusion at L3-L4 without significant spinal canal or neural foraminal stenosis or nerve root impingement. 3.  Chronic right and suspected left L5 pedicle fractures without marrow edema to suggest acute injury. 4. Diffuse T1 hypointensity of the bone marrow is nonspecific and can be seen in the setting of smoking, anemia, obesity, or less likely infiltrative marrow process.     Electronically Signed   By: Lesia Hausen M.D.   On: 03/14/2022 09:30    Lab Tests:  I Ordered, and personally interpreted labs.  The pertinent results include: Quantitative pregnancy is down to 4 confirming diagnosis of miscarriage,  lipase is normal,  CMP is normal except for very mild hyponatremia CBC shows white blood cell count 11.5,  hemoglobin is 9.2 which is around  her baseline and she does have mild thrombocytopenia.   Urine shows 11-20 red blood cells, no white blood cells, rare bacteria.  She does have squamous epithelial cells.  No UTI symptoms.  Do not feel she has UTI.    Problem List / ED Course / Critical interventions / Medication management  Child has history of chronic back pain with known large right central disc extrusion at L4-L5 and mild disc protrusion at L3-L4 from MRI last year.  She is able to ambulate but does have some pain, has no saddle anesthesia or paresthesia, no bowel or bladder incontinence, no urinary retention.  She has had gradually increasing pain she states since they switched her from oral Dilaudid to oxycodone.  She states the Dilaudid was making her too sleepy so she did not want to continue taking it but her pain is not increased.  She rated her pain under control in the ER and she is requesting to go home.  Advised on neurosurgery follow-up and strict return precautions.  She is I ordered medication including zofran and morphine  for nausea and pain  Reevaluation of the patient after these medicines showed that the patient improved I have reviewed the patients home medicines and have made adjustments as needed    Test / Admission - Considered:  Considered CT abd/pelvis, but  pt has no tenderness, states is mild pain that is resolving after recent miscarriage.    Amount and/or Complexity of Data Reviewed Labs: ordered.  Risk Prescription drug management.           Final Clinical Impression(s) / ED Diagnoses Final diagnoses:  Chronic bilateral low back pain, unspecified whether sciatica present  Nausea and vomiting, unspecified vomiting type    Rx / DC Orders ED Discharge Orders          Ordered    ondansetron (ZOFRAN) 4 MG tablet  Every 6 hours        03/17/23 1707              Ma Rings, PA-C 03/17/23 1818    Arby Barrette, MD 03/19/23 1807
